# Patient Record
Sex: Female | Born: 1935 | Race: White | Hispanic: No | Marital: Married | Smoking: Never smoker
Health system: Southern US, Community
[De-identification: ages and names within clinical notes are randomized; demographics above are authoritative.]

## PROBLEM LIST (undated history)

## (undated) DIAGNOSIS — I4891 Unspecified atrial fibrillation: Secondary | ICD-10-CM

## (undated) DIAGNOSIS — N189 Chronic kidney disease, unspecified: Secondary | ICD-10-CM

## (undated) DIAGNOSIS — C4490 Unspecified malignant neoplasm of skin, unspecified: Secondary | ICD-10-CM

## (undated) DIAGNOSIS — I2510 Atherosclerotic heart disease of native coronary artery without angina pectoris: Secondary | ICD-10-CM

## (undated) HISTORY — DX: Chronic kidney disease, unspecified: N18.9

## (undated) HISTORY — PX: KIDNEY SURGERY: SHX687

## (undated) HISTORY — DX: Unspecified malignant neoplasm of skin, unspecified: C44.90

---

## 1961-09-15 HISTORY — PX: ABDOMINAL HYSTERECTOMY: SHX81

## 1961-09-15 HISTORY — PX: PARTIAL COLECTOMY: SHX5273

## 1983-09-16 HISTORY — PX: BREAST SURGERY: SHX581

## 1998-03-15 HISTORY — PX: CHOLECYSTECTOMY: SHX55

## 2005-01-13 ENCOUNTER — Ambulatory Visit: Payer: Self-pay | Admitting: Ophthalmology

## 2005-01-24 ENCOUNTER — Ambulatory Visit: Payer: Self-pay | Admitting: Ophthalmology

## 2005-03-06 ENCOUNTER — Ambulatory Visit: Payer: Self-pay | Admitting: Family Medicine

## 2005-04-07 ENCOUNTER — Ambulatory Visit: Payer: Self-pay | Admitting: Gastroenterology

## 2005-04-07 ENCOUNTER — Emergency Department: Payer: Self-pay | Admitting: Emergency Medicine

## 2006-02-16 DIAGNOSIS — C44311 Basal cell carcinoma of skin of nose: Secondary | ICD-10-CM | POA: Insufficient documentation

## 2006-03-26 ENCOUNTER — Ambulatory Visit: Payer: Self-pay | Admitting: Family Medicine

## 2007-05-06 ENCOUNTER — Ambulatory Visit: Payer: Self-pay | Admitting: Family Medicine

## 2008-05-09 ENCOUNTER — Ambulatory Visit: Payer: Self-pay | Admitting: Family Medicine

## 2008-05-10 ENCOUNTER — Ambulatory Visit: Payer: Self-pay | Admitting: Cardiovascular Disease

## 2008-05-12 ENCOUNTER — Ambulatory Visit: Payer: Self-pay | Admitting: Family Medicine

## 2008-09-30 ENCOUNTER — Emergency Department: Payer: Self-pay | Admitting: Emergency Medicine

## 2009-05-10 ENCOUNTER — Ambulatory Visit: Payer: Self-pay | Admitting: Family Medicine

## 2009-05-18 ENCOUNTER — Ambulatory Visit: Payer: Self-pay | Admitting: Unknown Physician Specialty

## 2009-06-13 ENCOUNTER — Ambulatory Visit: Payer: Self-pay | Admitting: Unknown Physician Specialty

## 2010-05-13 ENCOUNTER — Ambulatory Visit: Payer: Self-pay | Admitting: Family Medicine

## 2011-05-26 ENCOUNTER — Ambulatory Visit: Payer: Self-pay | Admitting: Family Medicine

## 2011-11-21 ENCOUNTER — Ambulatory Visit: Payer: Self-pay | Admitting: Ophthalmology

## 2012-07-27 ENCOUNTER — Ambulatory Visit: Payer: Self-pay | Admitting: Family Medicine

## 2013-10-13 ENCOUNTER — Ambulatory Visit: Payer: Self-pay | Admitting: Family Medicine

## 2014-10-17 ENCOUNTER — Ambulatory Visit: Payer: Self-pay | Admitting: Family Medicine

## 2015-01-01 LAB — CBC AND DIFFERENTIAL
HCT: 37 % (ref 36–46)
HEMOGLOBIN: 12.1 g/dL (ref 12.0–16.0)
Neutrophils Absolute: 65 /uL
PLATELETS: 244 10*3/uL (ref 150–399)
WBC: 6.9 10*3/mL

## 2015-01-01 LAB — BASIC METABOLIC PANEL
BUN: 17 mg/dL (ref 4–21)
CREATININE: 1.2 mg/dL — AB (ref 0.5–1.1)
Glucose: 97 mg/dL
POTASSIUM: 4.4 mmol/L (ref 3.4–5.3)
Sodium: 143 mmol/L (ref 137–147)

## 2015-01-01 LAB — LIPID PANEL
CHOLESTEROL: 270 mg/dL — AB (ref 0–200)
HDL: 88 mg/dL — AB (ref 35–70)
LDL CALC: 157 mg/dL
TRIGLYCERIDES: 123 mg/dL (ref 40–160)

## 2015-01-01 LAB — HEPATIC FUNCTION PANEL
ALT: 13 U/L (ref 7–35)
AST: 19 U/L (ref 13–35)
Alkaline Phosphatase: 69 U/L (ref 25–125)
Bilirubin, Total: 0.8 mg/dL

## 2015-01-01 LAB — TSH: TSH: 1.24 u[IU]/mL (ref 0.41–5.90)

## 2015-04-05 ENCOUNTER — Other Ambulatory Visit: Payer: Self-pay | Admitting: Family Medicine

## 2015-04-05 DIAGNOSIS — F419 Anxiety disorder, unspecified: Secondary | ICD-10-CM

## 2015-04-05 MED ORDER — CLONAZEPAM 0.5 MG PO TABS: 0.5000 mg | ORAL_TABLET | Freq: Every day | ORAL | Status: DC

## 2015-04-05 NOTE — Telephone Encounter (Signed)
OK to call in rx. Thanks.

## 2015-04-05 NOTE — Telephone Encounter (Signed)
ClonazePAM..Marland Kitchen    Uses [facility]   Thanks [place]

## 2015-04-06 ENCOUNTER — Telehealth: Payer: Self-pay | Admitting: Family Medicine

## 2015-04-06 ENCOUNTER — Other Ambulatory Visit: Payer: Self-pay | Admitting: Family Medicine

## 2015-04-06 NOTE — Telephone Encounter (Signed)
Med called in. Tried to call pt to let her know and LMTCB

## 2015-04-16 ENCOUNTER — Telehealth: Payer: Self-pay | Admitting: Family Medicine

## 2015-04-16 NOTE — Telephone Encounter (Signed)
Pt called to get an appointment with Dr. Venia Minks because the medication refills that she got were causing her to feel dry and pt increased her fluids but still feels dry. I advised pt I could put on schedule for tomorrow but that I was also going to see if she needed to speak with a nurse. Pt stated ok and hung up. Thanks TNP

## 2015-04-17 ENCOUNTER — Encounter: Payer: Self-pay | Admitting: Family Medicine

## 2015-04-17 ENCOUNTER — Telehealth: Payer: Self-pay

## 2015-04-17 ENCOUNTER — Ambulatory Visit (INDEPENDENT_AMBULATORY_CARE_PROVIDER_SITE_OTHER): Payer: Medicare Other | Admitting: Family Medicine

## 2015-04-17 VITALS — BP 140/76 | HR 74 | Temp 98.4°F | Resp 16 | Wt 122.6 lb

## 2015-04-17 DIAGNOSIS — K589 Irritable bowel syndrome without diarrhea: Secondary | ICD-10-CM | POA: Insufficient documentation

## 2015-04-17 DIAGNOSIS — G4700 Insomnia, unspecified: Secondary | ICD-10-CM | POA: Diagnosis not present

## 2015-04-17 DIAGNOSIS — K219 Gastro-esophageal reflux disease without esophagitis: Secondary | ICD-10-CM | POA: Insufficient documentation

## 2015-04-17 DIAGNOSIS — I2510 Atherosclerotic heart disease of native coronary artery without angina pectoris: Secondary | ICD-10-CM | POA: Insufficient documentation

## 2015-04-17 DIAGNOSIS — E785 Hyperlipidemia, unspecified: Secondary | ICD-10-CM | POA: Insufficient documentation

## 2015-04-17 DIAGNOSIS — K635 Polyp of colon: Secondary | ICD-10-CM | POA: Insufficient documentation

## 2015-04-17 DIAGNOSIS — E559 Vitamin D deficiency, unspecified: Secondary | ICD-10-CM | POA: Insufficient documentation

## 2015-04-17 DIAGNOSIS — R319 Hematuria, unspecified: Secondary | ICD-10-CM | POA: Insufficient documentation

## 2015-04-17 MED ORDER — TEMAZEPAM 7.5 MG PO CAPS: 7.5000 mg | ORAL_CAPSULE | Freq: Every evening | ORAL | Status: DC | PRN

## 2015-04-17 NOTE — Progress Notes (Signed)
Patient ID: Amanda Robinson, female   DOB: [birth year], 81 y.o.   MRN: [id number]       [name] Female    DOB: [birth year]   81 y.o.   MRN: [id number] Visit Date: 04/17/2015  Today's Provider: Margarita Rana, MD   Chief Complaint  Patient presents with  . Follow-up    medication clonazepam 0.5 mg, "my mouth and throat get so dry with the medication" pt stated  . Follow-up    gerd  . Abdominal Pain    "IBS" pt stated, I have spells stomach pain,  . Follow-up    anxiety   Subjective:    Abdominal Pain This is a recurrent problem. The current episode started more than 1 year ago. The onset quality is undetermined. The problem occurs every several days. The problem has been unchanged. Pain location: accross, taking pantoprazole, it helps:" pt stated. The pain is at a severity of 3/10. The pain is mild.   Insomnia- Feels like Clonazepam is drying out her mouth. Even at 1/2 a tablet. Really wants to try another medication.  Has been on this medication for a long time. Has tried multiple OTC medication with out relief. Sister tried Temazepam and it really helps.    Allergies  Allergen Reactions  . Clarithromycin   . Colesevelam Hcl     Burns stomach.  . Propoxyphene    Previous Medications   ASPIRIN 81 MG CHEWABLE TABLET    Chew by mouth.   CLONAZEPAM (KLONOPIN) 0.5 MG TABLET    Take 0.5 mg by mouth at bedtime as needed for anxiety.   PANTOPRAZOLE (PROTONIX) 40 MG TABLET    Take by mouth.   PAROXETINE (PAXIL) 20 MG TABLET    Take by mouth.   SIMVASTATIN (ZOCOR) 20 MG TABLET    Take by mouth.    Review of Systems  Constitutional: Negative.   Eyes: Negative.   Respiratory: Negative.   Gastrointestinal: Positive for abdominal pain.  Neurological: Negative.   Psychiatric/Behavioral: Negative for behavioral problems. The patient is not nervous/anxious.        Does have insomnia if does not take her medication.     History  Substance Use Topics  . Smoking status:  Never Smoker   . Smokeless tobacco: Never Used  . Alcohol Use: No   Objective:   BP 140/76 mmHg  Pulse 74  Temp(Src) 98.4 F (36.9 C) (Oral)  Resp 16  Wt 122 lb 9.6 oz (55.611 kg)  Physical Exam  Constitutional: She is oriented to person, place, and time. She appears well-developed and well-nourished.  Cardiovascular: Normal rate and regular rhythm.   Pulmonary/Chest: Effort normal and breath sounds normal.  Neurological: She is alert and oriented to person, place, and time.      Assessment & Plan:     1. Cannot sleep Will try Temazepam, has helped her sister sleep without side effects.   Feels that Klonopin is too strong and causing dry mouth.   - temazepam (RESTORIL) 7.5 MG capsule; Take 1 capsule (7.5 mg total) by mouth at bedtime as needed for sleep. (Patient taking differently: Take 15 mg by mouth at bedtime as needed for sleep. )  Dispense: 30 capsule; Refill: [place], MD       Margarita Rana, MD  [facility]

## 2015-04-17 NOTE — Telephone Encounter (Signed)
Temazepam 7.5 mg was not covered by insurance. Per Dr. Sharyon Medicus verbal instructions, pharmacy will prescribe Temazepam 15 mg capsule. Renaldo Fiddler, CMA

## 2015-06-04 ENCOUNTER — Ambulatory Visit (INDEPENDENT_AMBULATORY_CARE_PROVIDER_SITE_OTHER): Payer: Medicare Other | Admitting: Family Medicine

## 2015-06-04 ENCOUNTER — Encounter: Payer: Self-pay | Admitting: Family Medicine

## 2015-06-04 VITALS — BP 126/66 | HR 88 | Temp 97.6°F | Resp 16 | Ht 63.0 in | Wt 118.0 lb

## 2015-06-04 DIAGNOSIS — G4700 Insomnia, unspecified: Secondary | ICD-10-CM

## 2015-06-04 DIAGNOSIS — M8580 Other specified disorders of bone density and structure, unspecified site: Secondary | ICD-10-CM | POA: Insufficient documentation

## 2015-06-04 DIAGNOSIS — Z0000 Encounter for general adult medical examination without abnormal findings: Secondary | ICD-10-CM

## 2015-06-04 DIAGNOSIS — Z23 Encounter for immunization: Secondary | ICD-10-CM

## 2015-06-04 MED ORDER — TEMAZEPAM 15 MG PO CAPS: 15.0000 mg | ORAL_CAPSULE | Freq: Every evening | ORAL | Status: DC | PRN

## 2015-06-04 NOTE — Progress Notes (Signed)
Patient ID: Amanda Robinson, female   DOB: 09-Dec-1921, 81 y.o.   MRN: [id number]        [name], Female    DOB: July 14, 1922, 81 y.o.   MRN: [id number] Visit Date: 06/04/2015  Today's Provider: Margarita Rana, MD   Chief Complaint  Patient presents with  . Medicare Wellness   Subjective:    Annual wellness visit [name] is a 81 y.o. female. She feels well. She reports exercising daily; stays very active with daily activities. She reports she is sleeping well.  10/10/13 CPE 10/17/14 Mammogram-BI-RADS 1 06/13/09 Colonoscopy-polyp, diverticulosis, internal hemorrhoids 04/12/07 BMD-osteopenia, recheck in 2 years 04/19/08 EKG  01/01/15 Labs stable, except low vitamin D. Recommended to start OTC Vit D 1000 iu daily. -----------------------------------------------------------   Review of Systems  Constitutional: Negative.   HENT: Positive for tinnitus and voice change.   Eyes: Positive for photophobia.  Cardiovascular: Negative.   Gastrointestinal: Positive for abdominal pain.  Endocrine: Positive for heat intolerance.  Genitourinary: Negative.   Musculoskeletal: Negative.   Skin: Negative.   Allergic/Immunologic: Negative.   Neurological: Positive for headaches.  Hematological: Negative.   Psychiatric/Behavioral: Positive for sleep disturbance. The patient is nervous/anxious.     Social History   Social History  . Marital Status: Married    Spouse Name: N/A  . Number of Children: 1  . Years of Education: N/A   Occupational History  . Not on file.   Social History Main Topics  . Smoking status: Never Smoker   . Smokeless tobacco: Never Used  . Alcohol Use: No  . Drug Use: No  . Sexual Activity: Not on file   Other Topics Concern  . Not on file   Social History Narrative    Patient Active Problem List   Diagnosis Date Noted  . Osteopenia 06/04/2015  . Atherosclerosis of coronary artery 04/17/2015  . Colon polyp 04/17/2015  .  Clinical depression 04/17/2015  . Acid reflux 04/17/2015  . Blood in the urine 04/17/2015  . HLD (hyperlipidemia) 04/17/2015  . Cannot sleep 04/17/2015  . 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 Avitaminosis D 04/17/2015  . Anxiety 04/05/2015  . Basal cell carcinoma of nose 02/16/2006    Past Surgical History  Procedure Laterality Date  . Cholecystectomy  03/1998  . Breast surgery Bilateral 1985    Biopsy  . Abdominal hysterectomy  1963  . Partial colectomy  1963  . Kidney surgery      due to ureteropelvic stricture    Her family history includes Alcohol abuse in her brother; Alzheimer's disease in her father; Brain cancer in her sister; Colon cancer in her other; Esophageal cancer in her mother; Ovarian cancer in her other; Parkinsonism in her brother.    Previous Medications   CHOLECALCIFEROL (VITAMIN D) 1000 UNITS TABLET    Take 1,000 Units by mouth daily.   PANTOPRAZOLE (PROTONIX) 40 MG TABLET    Take by mouth.   PAROXETINE (PAXIL) 20 MG TABLET    Take by mouth.   SIMVASTATIN (ZOCOR) 20 MG TABLET    Take by mouth.    Patient Care Team: Margarita Rana, MD as PCP - General (Family Medicine)     Objective:   Vitals: BP 126/66 mmHg  Pulse 88  Temp(Src) 97.6 F (36.4 C) (Oral)  Resp 16  Ht 5\' 3"  (1.6 m)  Wt 118 lb (53.524 kg)  BMI 20.91 kg/m2  SpO2 100%  Physical Exam  Constitutional: She is oriented to person, place, and  time. She appears well-developed and well-nourished.  HENT:  Head: Normocephalic and atraumatic.  Right Ear: Tympanic membrane, external ear and ear canal normal.  Left Ear: Tympanic membrane, external ear and ear canal normal.  Nose: Nose normal.  Mouth/Throat: Uvula is midline, oropharynx is clear and moist and mucous membranes are normal.  Eyes: Conjunctivae, EOM and lids are normal. Pupils are equal, round, and reactive to light.  Neck: Trachea normal and normal range of motion. Neck supple. Carotid bruit is not present. No thyroid mass and no  thyromegaly present.  Cardiovascular: Normal rate, regular rhythm and normal heart sounds.   Pulmonary/Chest: Effort normal and breath sounds normal.  Abdominal: Soft. Normal appearance and bowel sounds are normal. There is no hepatosplenomegaly. There is no tenderness.  Musculoskeletal: Normal range of motion.  Lymphadenopathy:    She has no cervical adenopathy.    She has no axillary adenopathy.  Neurological: She is alert and oriented to person, place, and time. She has normal strength. No cranial nerve deficit.  Skin: Skin is warm, dry and intact.  Psychiatric: She has a normal mood and affect. Her speech is normal and behavior is normal. Judgment and thought content normal. Cognition and memory are normal.    Activities of Daily Living In your present state of health, do you have any difficulty performing the following activities: 06/04/2015  Hearing? Y  Vision? N  Difficulty concentrating or making decisions? N  Walking or climbing stairs? N  Dressing or bathing? N  Doing errands, shopping? N    Fall Risk Assessment Fall Risk  06/04/2015 04/17/2015  Falls in the past year? No No     Depression Screen PHQ 2/9 Scores 06/04/2015 04/17/2015  PHQ - 2 Score 0 1  Exception Documentation - Medical reason   History  Alcohol Use No    Cognitive Testing - 6-CIT  Correct? Score   What year is it? yes 0 0 or 4  What month is it? yes 0 0 or 3  Memorize:    [name],  44,  [address],  [place],      What time is it? (within 1 hour) yes 0 0 or 3  Count backwards from 20 yes 0 0, 2, or 4  Name the months of the year yes 0 0, 2, or 4  Repeat name & address above yes 1 0, 2, 4, 6, 8, or 10       TOTAL SCORE  1/28   Interpretation:  Normal  Normal (0-7) Abnormal (8-28)       Assessment & Plan:     Annual Wellness Visit  Reviewed patient's Family Medical History Reviewed and updated list of patient's medical providers Assessment of cognitive impairment was done Assessed  patient's functional ability Established a written schedule for health screening [place] Completed and Reviewed  Exercise Activities and Dietary recommendations Goals    . Exercise 150 minutes per week (moderate activity)       Immunization History  Administered Date(s) Administered  . Influenza, High Dose Seasonal PF 06/04/2015  . Pneumococcal Conjugate-13 01/01/2015  . Pneumococcal Polysaccharide-23 07/09/2006  . Td 07/09/2006  . Tdap 06/23/2011    Health Maintenance  Topic Date Due  . COLONOSCOPY  07/19/1974  . ZOSTAVAX  07/19/1984  . DEXA SCAN  07/19/1989  . INFLUENZA VACCINE  04/16/2015  . TETANUS/TDAP  06/22/2021  . PNA vac Low Risk Adult  Completed       1. Medicare annual wellness  visit, subsequent Stable. Patient advised to continue eating healthy and exercise daily.   2. Need for influenza vaccination - Flu vaccine HIGH DOSE PF  3. Insomnia Stable. Patient advised to continue temazepam 15 mg as below. - temazepam (RESTORIL) 15 MG capsule; Take 1 capsule (15 mg total) by mouth at bedtime as needed for sleep.  Dispense: 30 capsule; Refill: 5  6. Osteopenia BMD-order.   Patient seen and examined by Dr. Jerrell Belfast, and note scribed by Philbert Riser. [place], CMA.    ------------------------------------------------------------------------------------------------------------

## 2015-06-05 ENCOUNTER — Telehealth: Payer: Self-pay | Admitting: Family Medicine

## 2015-06-05 NOTE — Telephone Encounter (Signed)
FYI---Pt did not want to set up appointment for bone density

## 2015-06-27 ENCOUNTER — Telehealth: Payer: Self-pay | Admitting: Family Medicine

## 2015-06-27 NOTE — Telephone Encounter (Signed)
Pt stated that she started taking temazepam (RESTORIL) 15 MG capsule she has been having crazy dreams and feels like it is messing with her mind. Pt stated she has been forgetting were she puts things. Pt stated her mind was fine before taking the medication. Pt stated the last time she took the medication was on 06/25/15 before bed. Thanks TNP

## 2015-06-27 NOTE — Telephone Encounter (Signed)
Amanda Robinson has noticed improvement with her symptoms since she has stopped temazepam.  Thanks,   -[name]

## 2015-12-25 ENCOUNTER — Other Ambulatory Visit: Payer: Self-pay

## 2015-12-25 MED ORDER — SIMVASTATIN 20 MG PO TABS: 20.0000 mg | ORAL_TABLET | Freq: Every day | ORAL | Status: DC

## 2015-12-25 MED ORDER — PANTOPRAZOLE SODIUM 40 MG PO TBEC: 40.0000 mg | DELAYED_RELEASE_TABLET | Freq: Every day | ORAL | Status: DC

## 2015-12-25 NOTE — Telephone Encounter (Signed)
LOV 06/04/15 and no future appointments made, refill request from [facility] mail service for Simvastatin and Pantoprazole-aa

## 2016-01-17 ENCOUNTER — Ambulatory Visit (INDEPENDENT_AMBULATORY_CARE_PROVIDER_SITE_OTHER): Payer: Medicare Other | Admitting: Family Medicine

## 2016-01-17 ENCOUNTER — Encounter: Payer: Self-pay | Admitting: Family Medicine

## 2016-01-17 VITALS — BP 128/72 | HR 79 | Temp 98.0°F | Resp 16 | Wt 119.6 lb

## 2016-01-17 DIAGNOSIS — I2510 Atherosclerotic heart disease of native coronary artery without angina pectoris: Secondary | ICD-10-CM

## 2016-01-17 DIAGNOSIS — F419 Anxiety disorder, unspecified: Secondary | ICD-10-CM

## 2016-01-17 DIAGNOSIS — E559 Vitamin D deficiency, unspecified: Secondary | ICD-10-CM | POA: Diagnosis not present

## 2016-01-17 DIAGNOSIS — F329 Major depressive disorder, single episode, unspecified: Secondary | ICD-10-CM | POA: Diagnosis not present

## 2016-01-17 DIAGNOSIS — Z1239 Encounter for other screening for malignant neoplasm of breast: Secondary | ICD-10-CM | POA: Diagnosis not present

## 2016-01-17 DIAGNOSIS — E785 Hyperlipidemia, unspecified: Secondary | ICD-10-CM | POA: Diagnosis not present

## 2016-01-17 DIAGNOSIS — F32A Depression, unspecified: Secondary | ICD-10-CM

## 2016-01-17 MED ORDER — PAROXETINE HCL 20 MG PO TABS: 20.0000 mg | ORAL_TABLET | Freq: Every day | ORAL | Status: DC

## 2016-01-17 NOTE — Progress Notes (Signed)
Patient ID: Amanda Robinson, female   DOB: [birth year], 82 y.o.   MRN: [id number]   [name] Female    DOB: [birth year]   82 y.o.   MRN: [id number] Visit Date: 01/17/2016  Today's Provider: Margarita Rana, MD   Chief Complaint  Patient presents with  . Insomnia  . Depression  . Hyperlipidemia  . Follow-up   Subjective:    HPI    Lipid/Cholesterol, Follow-up:   Last seen for this 8 months ago.  Management since that visit includes continue Simvastatin 20 mg.  Last Lipid Panel:    Component Value Date/Time   CHOL 270* 01/01/2015   TRIG 123 01/01/2015   HDL 88* 01/01/2015   LDLCALC 157 01/01/2015    She reports good compliance with treatment. She is not having side effects.   Wt Readings from Last 3 Encounters:  01/17/16 119 lb 9.6 oz (54.25 kg)  06/04/15 118 lb (53.524 kg)  04/17/15 122 lb 9.6 oz (55.611 kg)    ------------------------------------------------------------------------ Insomnia Follow Up: Patient stopped taking Temazepam on 06/25/2015 due to crazy dreams. Patient started taking OTC sleep aid- 1/2 tablet a night. Patient reports symptoms have improved since taking OTC medication.  Depression Follow Up: Patient reports symptoms are stable. Patient reports good compliance with treatment. Patient denies any side effects of medication.     Previous Medications   CHOLECALCIFEROL (VITAMIN D) 1000 UNITS TABLET    Take 1,000 Units by mouth daily.   DOXYLAMINE SUCCINATE, SLEEP, (SLEEP AID PO)    Take by mouth.   PANTOPRAZOLE (PROTONIX) 40 MG TABLET    Take 1 tablet (40 mg total) by mouth daily.   PAROXETINE (PAXIL) 20 MG TABLET    Take by mouth.   SIMVASTATIN (ZOCOR) 20 MG TABLET    Take 1 tablet (20 mg total) by mouth daily.   TEMAZEPAM (RESTORIL) 15 MG CAPSULE    Take 1 capsule (15 mg total) by mouth at bedtime as needed for sleep.   Allergies  Allergen Reactions  . Clarithromycin   . Colesevelam Hcl     Burns stomach.  . Propoxyphene      Review of Systems  Constitutional: Negative.   HENT: Negative.   Eyes: Negative.   Respiratory: Negative.   Cardiovascular: Negative.   Gastrointestinal: Negative.   Endocrine: Negative.   Genitourinary: Negative.   Musculoskeletal: Negative.   Skin: Negative.   Allergic/Immunologic: Negative.   Neurological: Negative.   Hematological: Negative.   Psychiatric/Behavioral: Positive for sleep disturbance.       Depression     Social History  Substance Use Topics  . Smoking status: Never Smoker   . Smokeless tobacco: Never Used  . Alcohol Use: No   Objective:   BP 128/72 mmHg  Pulse 79  Temp(Src) 98 F (36.7 C) (Oral)  Resp 16  Wt 119 lb 9.6 oz (54.25 kg)  Physical Exam  Constitutional: She is oriented to person, place, and time. She appears well-developed and well-nourished.  Cardiovascular: Normal rate and regular rhythm.   Pulmonary/Chest: Effort normal and breath sounds normal.  Neurological: She is alert and oriented to person, place, and time.      Assessment & Plan:     1. Atherosclerosis of native coronary artery of native heart without angina pectoris Stable. Check labs.  - Lipid panel - Comprehensive Metabolic Panel (CMET) - CBC  2. HLD (hyperlipidemia) Check labs as above.   3. Clinical depression Stable. Refilled medication today. Would like to continue.  4. Avitaminosis D Will check labs.   - VITAMIN D 25 Hydroxy (Vit-D Deficiency, Fractures)  5. Anxiety Stable on Paxil. Husband has been sick. Did loose some weight but gained it back.   - TSH  6. Breast cancer screening Will call for appointment. Wishes to continue with screening.   - MM Digital Screening   Follow up: In 6 months with Lillia Abed, PA for Wellness.   Patient was seen and examined by Jerrell Belfast, MD, and note scribed by Melanie Crazier, [place].   Margarita Rana, MD

## 2016-01-22 LAB — LIPID PANEL
CHOLESTEROL TOTAL: 206 mg/dL — AB (ref 100–199)
Chol/HDL Ratio: 2.8 ratio units (ref 0.0–4.4)
HDL: 73 mg/dL (ref >39)
LDL Calculated: 110 mg/dL — ABNORMAL HIGH (ref 0–99)
Triglycerides: 113 mg/dL (ref 0–149)
VLDL CHOLESTEROL CAL: 23 mg/dL (ref 5–40)

## 2016-01-22 LAB — COMPREHENSIVE METABOLIC PANEL
ALBUMIN: 3.8 g/dL (ref 3.2–4.6)
ALK PHOS: 75 IU/L (ref 39–117)
ALT: 11 IU/L (ref 0–32)
AST: 16 IU/L (ref 0–40)
Albumin/Globulin Ratio: 1.2 (ref 1.2–2.2)
BILIRUBIN TOTAL: 0.8 mg/dL (ref 0.0–1.2)
BUN / CREAT RATIO: 13 (ref 12–28)
BUN: 15 mg/dL (ref 10–36)
CHLORIDE: 101 mmol/L (ref 96–106)
CO2: 24 mmol/L (ref 18–29)
CREATININE: 1.14 mg/dL — AB (ref 0.57–1.00)
Calcium: 9.4 mg/dL (ref 8.7–10.3)
GFR calc non Af Amer: 42 mL/min/{1.73_m2} — ABNORMAL LOW (ref >59)
GFR, EST AFRICAN AMERICAN: 49 mL/min/{1.73_m2} — AB (ref >59)
GLOBULIN, TOTAL: 3.1 g/dL (ref 1.5–4.5)
Glucose: 92 mg/dL (ref 65–99)
Potassium: 4.6 mmol/L (ref 3.5–5.2)
SODIUM: 140 mmol/L (ref 134–144)
Total Protein: 6.9 g/dL (ref 6.0–8.5)

## 2016-01-22 LAB — CBC
Hematocrit: 34.3 % (ref 34.0–46.6)
Hemoglobin: 11.4 g/dL (ref 11.1–15.9)
MCH: 30 pg (ref 26.6–33.0)
MCHC: 33.2 g/dL (ref 31.5–35.7)
MCV: 90 fL (ref 79–97)
PLATELETS: 240 10*3/uL (ref 150–379)
RBC: 3.8 x10E6/uL (ref 3.77–5.28)
RDW: 13.7 % (ref 12.3–15.4)
WBC: 6.4 10*3/uL (ref 3.4–10.8)

## 2016-01-22 LAB — TSH: TSH: 0.896 u[IU]/mL (ref 0.450–4.500)

## 2016-01-22 LAB — VITAMIN D 25 HYDROXY (VIT D DEFICIENCY, FRACTURES): Vit D, 25-Hydroxy: 18.5 ng/mL — ABNORMAL LOW (ref 30.0–100.0)

## 2016-01-28 ENCOUNTER — Ambulatory Visit (INDEPENDENT_AMBULATORY_CARE_PROVIDER_SITE_OTHER): Payer: Medicare Other | Admitting: Physician Assistant

## 2016-01-28 ENCOUNTER — Encounter: Payer: Self-pay | Admitting: Physician Assistant

## 2016-01-28 VITALS — BP 140/82 | HR 80 | Temp 98.3°F | Resp 16 | Wt 117.6 lb

## 2016-01-28 DIAGNOSIS — S91311A Laceration without foreign body, right foot, initial encounter: Secondary | ICD-10-CM | POA: Diagnosis not present

## 2016-01-28 MED ORDER — CEPHALEXIN 500 MG PO CAPS: 500.0000 mg | ORAL_CAPSULE | Freq: Two times a day (BID) | ORAL | Status: DC

## 2016-01-28 NOTE — Patient Instructions (Signed)

## 2016-01-28 NOTE — Progress Notes (Signed)
[name] Female    DOB: 1921-12-01   82 y.o.   MRN: [id number] Visit Date: 01/28/2016  Today's Provider: Mar Daring, PA-C   Chief Complaint  Patient presents with  . Foot Pain   Subjective:    Foot Pain This is a new (Right foot) problem. The current episode started 1 to 4 weeks ago (For 10 days). The problem occurs constantly (Per patient, she got cut with a storm door. This happened 10 days ago). The problem has been gradually improving (Per patient was worse, especially yesterday was very swollen). Pertinent negatives include no arthralgias, chest pain, chills, congestion, coughing, fatigue, fever, joint swelling, myalgias, nausea, numbness, rash, vomiting or weakness. Associated symptoms comments: Burning pain and redness on it with drainage. Exacerbated by: throbs at night if she has been up on it alot throughout the day. She has tried ice and acetaminophen (Hydrogen peroxide until 2 days ago, bandage and neosporin) for the symptoms. The treatment provided no relief.       Allergies  Allergen Reactions  . Clarithromycin   . Colesevelam Hcl     Burns stomach.  . Propoxyphene    Previous Medications   CHOLECALCIFEROL (VITAMIN D) 1000 UNITS TABLET    Take 1,000 Units by mouth daily.   DOXYLAMINE SUCCINATE, SLEEP, (SLEEP AID PO)    Take by mouth.   PANTOPRAZOLE (PROTONIX) 40 MG TABLET    Take 1 tablet (40 mg total) by mouth daily.   PAROXETINE (PAXIL) 20 MG TABLET    Take 1 tablet (20 mg total) by mouth daily.   SIMVASTATIN (ZOCOR) 20 MG TABLET    Take 1 tablet (20 mg total) by mouth daily.   TEMAZEPAM (RESTORIL) 15 MG CAPSULE    Take 1 capsule (15 mg total) by mouth at bedtime as needed for sleep.    Review of Systems  Constitutional: Negative for fever, chills and fatigue.  HENT: Negative for congestion.   Respiratory: Negative for cough.   Cardiovascular: Negative for chest pain.  Gastrointestinal: Negative for nausea and vomiting.    Musculoskeletal: Negative for myalgias, joint swelling and arthralgias.  Skin: Negative for rash.  Neurological: Negative for weakness and numbness.    Social History  Substance Use Topics  . Smoking status: Never Smoker   . Smokeless tobacco: Never Used  . Alcohol Use: No   Objective:   BP 140/82 mmHg  Pulse 80  Temp(Src) 98.3 F (36.8 C) (Oral)  Resp 16  Wt 117 lb 9.6 oz (53.343 kg)  Physical Exam  Constitutional: She appears well-developed and well-nourished. No distress.  Cardiovascular: Normal rate, regular rhythm and normal heart sounds.   No murmur heard. Pulmonary/Chest: Effort normal and breath sounds normal. No respiratory distress. She has no wheezes. She has no rales.  Skin: Laceration noted. She is not diaphoretic.           Assessment & Plan:     1. Foot laceration, right, initial encounter Advised her to try to keep foot elevated for swelling. May use ice and heat alternating 15-20 minutes on. Advised to not use hydrogen peroxide any longer. May continue to use neosporin and cover wound if needed. Keflex given for redness and tenderness. She is to call if her foot does not improve or if it worsens in the meantime. Discussed if foot continues to hurt may benefit with xray to R/O fracture.  - cephALEXin (KEFLEX) 500 MG capsule; Take 1 capsule (500 mg total)  by mouth 2 (two) times daily.  Dispense: 14 capsule; Refill: 0       Mar Daring, PA-C  [facility]

## 2016-03-17 ENCOUNTER — Encounter: Payer: Self-pay | Admitting: Physician Assistant

## 2016-03-17 ENCOUNTER — Ambulatory Visit (INDEPENDENT_AMBULATORY_CARE_PROVIDER_SITE_OTHER): Payer: Medicare Other | Admitting: Physician Assistant

## 2016-03-17 VITALS — BP 160/80 | HR 84 | Temp 98.5°F | Resp 16 | Wt 118.6 lb

## 2016-03-17 DIAGNOSIS — L03116 Cellulitis of left lower limb: Secondary | ICD-10-CM

## 2016-03-17 DIAGNOSIS — T148 Other injury of unspecified body region: Secondary | ICD-10-CM | POA: Diagnosis not present

## 2016-03-17 DIAGNOSIS — W548XXA Other contact with dog, initial encounter: Secondary | ICD-10-CM | POA: Diagnosis not present

## 2016-03-17 MED ORDER — AMOXICILLIN-POT CLAVULANATE 875-125 MG PO TABS: 1.0000 | ORAL_TABLET | Freq: Two times a day (BID) | ORAL | Status: DC

## 2016-03-17 NOTE — Progress Notes (Signed)
[name] Female    DOB: 08-Jan-1922   82 y.o.   MRN: [id number] Visit Date: 03/17/2016  Today's Provider: Mar Daring, PA-C   Chief Complaint  Patient presents with  . Leg Pain   Subjective:    Leg Pain  The incident occurred more than 1 week ago. The incident occurred at home. Injury mechanism: "The dog scratched her two weeks ago. The pain is present in the left leg. The quality of the pain is described as burning (sometimes a sharp pain). The pain is mild. She reports no foreign bodies present. The symptoms are aggravated by movement. She has tried rest, acetaminophen and elevation (warm water. Neosporin) for the symptoms. The treatment provided no relief.  Leg is red around the laceration. Drainage is clear with blood. No purulent drainage, no foul smell. She also reports that her leg gets swollen when she is up on it during the day. No pain with walking.      Allergies  Allergen Reactions  . Clarithromycin   . Colesevelam Hcl     Burns stomach.  . Propoxyphene    Current Meds  Medication Sig  . cholecalciferol (VITAMIN D) 1000 UNITS tablet Take 1,000 Units by mouth daily.  . Doxylamine Succinate, Sleep, (SLEEP AID PO) Take by mouth.  . pantoprazole (PROTONIX) 40 MG tablet Take 1 tablet (40 mg total) by mouth daily.  Marland Kitchen PARoxetine (PAXIL) 20 MG tablet Take 1 tablet (20 mg total) by mouth daily.  . simvastatin (ZOCOR) 20 MG tablet Take 1 tablet (20 mg total) by mouth daily.    Review of Systems  Constitutional: Negative.   Respiratory: Negative.   Cardiovascular: Positive for leg swelling (occasional). Negative for chest pain and palpitations.  Musculoskeletal: Negative for gait problem.  Skin: Positive for wound.    Social History  Substance Use Topics  . Smoking status: Never Smoker   . Smokeless tobacco: Never Used  . Alcohol Use: No   Objective:   BP 160/80 mmHg  Pulse 84  Temp(Src) 98.5 F (36.9 C) (Oral)  Resp 16  Wt 118 lb  9.6 oz (53.797 kg)  Physical Exam  Constitutional: She appears well-developed and well-nourished. No distress.  Neck: Normal range of motion. Neck supple.  Cardiovascular: Normal rate, regular rhythm and normal heart sounds.  Exam reveals no gallop and no friction rub.   No murmur heard. Pulmonary/Chest: Effort normal and breath sounds normal. No respiratory distress. She has no wheezes. She has no rales.  Skin: Laceration noted. She is not diaphoretic.     Vitals reviewed.     Assessment & Plan:     1. Dog scratch Will give augmentin as below for infection. Wound was cleaned today in the office and dressed. Silvadene cream was applied to the wound, then covered with a small piece of vaseline gauze, and covered with a non-adherent pad. It was then secured with a self-adherent bandage. She is to leave on for 24 hours then remove. She can continue to wash wound with warm water and soap. I will see her back in 2-3 days to recheck wound. - amoxicillin-clavulanate (AUGMENTIN) 875-125 MG tablet; Take 1 tablet by mouth 2 (two) times daily.  Dispense: 20 tablet; Refill: 0  2. Cellulitis of left lower extremity See above medical treatment plan. - amoxicillin-clavulanate (AUGMENTIN) 875-125 MG tablet; Take 1 tablet by mouth 2 (two) times daily.  Dispense: 20 tablet; Refill: 0  Mar Daring, PA-C  [facility]

## 2016-03-17 NOTE — Patient Instructions (Signed)

## 2016-03-20 ENCOUNTER — Ambulatory Visit (INDEPENDENT_AMBULATORY_CARE_PROVIDER_SITE_OTHER): Payer: Medicare Other | Admitting: Physician Assistant

## 2016-03-20 ENCOUNTER — Encounter: Payer: Self-pay | Admitting: Physician Assistant

## 2016-03-20 VITALS — BP 140/70 | HR 79 | Temp 98.1°F | Resp 16

## 2016-03-20 DIAGNOSIS — L03116 Cellulitis of left lower limb: Secondary | ICD-10-CM | POA: Diagnosis not present

## 2016-03-20 DIAGNOSIS — W548XXA Other contact with dog, initial encounter: Secondary | ICD-10-CM

## 2016-03-20 DIAGNOSIS — T148 Other injury of unspecified body region: Secondary | ICD-10-CM | POA: Diagnosis not present

## 2016-03-20 NOTE — Patient Instructions (Signed)

## 2016-03-20 NOTE — Progress Notes (Signed)
       [name] Female    DOB: [birth year]   82 y.o.   MRN: [id number] Visit Date: 03/20/2016  Today's Provider: Mar Daring, PA-C   No chief complaint on file.  Subjective:    HPI  Patient is here for 3 day follow-up on wound on left leg and cellulitis.She reports it looks much better. She states the pain is decreasing tremendously. She only has pain in one small area near the corner. It does not drain as much as before. Still denies fever, chills, nausea or vomiting.     Allergies  Allergen Reactions  . Clarithromycin   . Colesevelam Hcl     Burns stomach.  . Propoxyphene    Current Meds  Medication Sig  . amoxicillin-clavulanate (AUGMENTIN) 875-125 MG tablet Take 1 tablet by mouth 2 (two) times daily.  . cholecalciferol (VITAMIN D) 1000 UNITS tablet Take 1,000 Units by mouth daily.  . Doxylamine Succinate, Sleep, (SLEEP AID PO) Take by mouth.  . pantoprazole (PROTONIX) 40 MG tablet Take 1 tablet (40 mg total) by mouth daily.  Marland Kitchen PARoxetine (PAXIL) 20 MG tablet Take 1 tablet (20 mg total) by mouth daily.  . simvastatin (ZOCOR) 20 MG tablet Take 1 tablet (20 mg total) by mouth daily.    Review of Systems  Constitutional: Negative.   Respiratory: Negative.   Cardiovascular: Negative.   Gastrointestinal: Negative.   Neurological: Negative.     Social History  Substance Use Topics  . Smoking status: Never Smoker   . Smokeless tobacco: Never Used  . Alcohol Use: No   Objective:   BP 140/70 mmHg  Pulse 79  Temp(Src) 98.1 F (36.7 C) (Oral)  Resp 16  Wt   Physical Exam  Constitutional: She appears well-developed and well-nourished. No distress.  Cardiovascular: Normal rate, regular rhythm and normal heart sounds.  Exam reveals no gallop and no friction rub.   No murmur heard. Pulmonary/Chest: Effort normal and breath sounds normal. No respiratory distress. She has no wheezes. She has no rales.  Skin: Laceration noted. She is not diaphoretic.      Vitals reviewed.      Assessment & Plan:      1. Dog scratch Continuing to heal. Continue augmentin as previously prescribed. Will f/u in one week to make sure healing and another round not needed. She is to call if symptoms worsen before that time.  2. Cellulitis of left lower extremity See above medical treatment plan.       Mar Daring, PA-C  [facility]

## 2016-03-27 ENCOUNTER — Ambulatory Visit (INDEPENDENT_AMBULATORY_CARE_PROVIDER_SITE_OTHER): Payer: Medicare Other | Admitting: Physician Assistant

## 2016-03-27 ENCOUNTER — Encounter: Payer: Self-pay | Admitting: Physician Assistant

## 2016-03-27 VITALS — BP 130/60 | HR 89 | Temp 97.4°F | Resp 16 | Wt 119.4 lb

## 2016-03-27 DIAGNOSIS — L03116 Cellulitis of left lower limb: Secondary | ICD-10-CM | POA: Diagnosis not present

## 2016-03-27 DIAGNOSIS — W548XXA Other contact with dog, initial encounter: Secondary | ICD-10-CM

## 2016-03-27 DIAGNOSIS — T148 Other injury of unspecified body region: Secondary | ICD-10-CM

## 2016-03-27 MED ORDER — AMOXICILLIN-POT CLAVULANATE 875-125 MG PO TABS: 1.0000 | ORAL_TABLET | Freq: Two times a day (BID) | ORAL | Status: DC

## 2016-03-27 NOTE — Progress Notes (Signed)
       [name] Female    DOB: [birth year]   82 y.o.   MRN: [id number] Visit Date: 03/27/2016  Today's Provider: Mar Daring, PA-C   Chief Complaint  Patient presents with  . Follow-up    Dog Scratch   Subjective:    HPI Amanda Robinson is here for her 1 week follow-up dog scratch. She reports that she can walk bettter, sometimes gets some shooting pain. She finished the Augmentin last night. Wound is healing. No drainage, no foul smell. Denies fevers, chills, nausea or vomiting.    Allergies  Allergen Reactions  . Clarithromycin   . Colesevelam Hcl     Burns stomach.  . Propoxyphene    Current Meds  Medication Sig  . cholecalciferol (VITAMIN D) 1000 UNITS tablet Take 1,000 Units by mouth daily.  . Doxylamine Succinate, Sleep, (SLEEP AID PO) Take by mouth.  . pantoprazole (PROTONIX) 40 MG tablet Take 1 tablet (40 mg total) by mouth daily.  Marland Kitchen PARoxetine (PAXIL) 20 MG tablet Take 1 tablet (20 mg total) by mouth daily.  . simvastatin (ZOCOR) 20 MG tablet Take 1 tablet (20 mg total) by mouth daily.    Review of Systems  Constitutional: Negative.   Respiratory: Negative.   Cardiovascular: Negative.   Gastrointestinal: Negative.   Skin: Positive for wound.    Social History  Substance Use Topics  . Smoking status: Never Smoker   . Smokeless tobacco: Never Used  . Alcohol Use: No   Objective:   BP 130/60 mmHg  Pulse 89  Temp(Src) 97.4 F (36.3 C) (Oral)  Resp 16  Wt 119 lb 6.4 oz (54.159 kg)  Physical Exam  Constitutional: She appears well-developed and well-nourished. No distress.  Cardiovascular: Normal rate, regular rhythm and normal heart sounds.  Exam reveals no gallop and no friction rub.   No murmur heard. Pulmonary/Chest: Effort normal and breath sounds normal. No respiratory distress. She has no wheezes. She has no rales.  Skin: She is not diaphoretic.     Vitals reviewed.     Assessment & Plan:     1. Dog scratch Healing  well. Advised she may use just dry bandage now. Will give a few more days of antibiotic coverage due to redness. She is to call if the wound worsens or if she develops signs of infection. - amoxicillin-clavulanate (AUGMENTIN) 875-125 MG tablet; Take 1 tablet by mouth 2 (two) times daily.  Dispense: 8 tablet; Refill: 0  2. Cellulitis of left lower extremity Improving. See above. - amoxicillin-clavulanate (AUGMENTIN) 875-125 MG tablet; Take 1 tablet by mouth 2 (two) times daily.  Dispense: 8 tablet; Refill: 0       Mar Daring, PA-C  [facility]

## 2016-03-27 NOTE — Patient Instructions (Signed)
Amoxicillin; Clavulanic Acid tablets  What is this medicine?  AMOXICILLIN; CLAVULANIC ACID (a mox i SIL in; KLAV yoo lan ic AS id) is a penicillin antibiotic. It is used to treat certain kinds of bacterial infections. It will not work for colds, flu, or other viral infections.  This medicine may be used for other purposes; ask your health care provider or pharmacist if you have questions.  What should I tell my health care provider before I take this medicine?  They need to know if you have any of these conditions:  -bowel disease, like colitis  -kidney disease  -liver disease  -mononucleosis  -an unusual or allergic reaction to amoxicillin, penicillin, cephalosporin, other antibiotics, clavulanic acid, other medicines, foods, dyes, or preservatives  -pregnant or trying to get pregnant  -breast-feeding  How should I use this medicine?  Take this medicine by mouth with a full glass of water. Follow the directions on the prescription label. Take at the start of a meal. Do not crush or chew. If the tablet has a score line, you may cut it in half at the score line for easier swallowing. Take your medicine at regular intervals. Do not take your medicine more often than directed. Take all of your medicine as directed even if you think you are better. Do not skip doses or stop your medicine early.  Talk to your pediatrician regarding the use of this medicine in children. Special care may be needed.  Overdosage: If you think you have taken too much of this medicine contact a poison control center or emergency room at once.  NOTE: This medicine is only for you. Do not share this medicine with others.  What if I miss a dose?  If you miss a dose, take it as soon as you can. If it is almost time for your next dose, take only that dose. Do not take double or extra doses.  What may interact with this medicine?  -allopurinol  -anticoagulants  -birth control pills  -methotrexate  -probenecid  This list may not describe all possible  interactions. Give your health care provider a list of all the medicines, herbs, non-prescription drugs, or dietary supplements you use. Also tell them if you smoke, drink alcohol, or use illegal drugs. Some items may interact with your medicine.  What should I watch for while using this medicine?  Tell your doctor or health care professional if your symptoms do not improve.  Do not treat diarrhea with over the counter products. Contact your doctor if you have diarrhea that lasts more than 2 days or if it is severe and watery.  If you have diabetes, you may get a false-positive result for sugar in your urine. Check with your doctor or health care professional.  Birth control pills may not work properly while you are taking this medicine. Talk to your doctor about using an extra method of birth control.  What side effects may I notice from receiving this medicine?  Side effects that you should report to your doctor or health care professional as soon as possible:  -allergic reactions like skin rash, itching or hives, swelling of the face, lips, or tongue  -breathing problems  -dark urine  -fever or chills, sore throat  -redness, blistering, peeling or loosening of the skin, including inside the mouth  -seizures  -trouble passing urine or change in the amount of urine  -unusual bleeding, bruising  -unusually weak or tired  -white patches or sores in the mouth   or throat  Side effects that usually do not require medical attention (report to your doctor or health care professional if they continue or are bothersome):  -diarrhea  -dizziness  -headache  -nausea, vomiting  -stomach upset  -vaginal or anal irritation  This list may not describe all possible side effects. Call your doctor for medical advice about side effects. You may report side effects to FDA at 1-800-FDA-1088.  Where should I keep my medicine?  Keep out of the reach of children.  Store at room temperature below 25 degrees C (77 degrees F). Keep container  tightly closed. Throw away any unused medicine after the expiration date.  NOTE: This sheet is a summary. It may not cover all possible information. If you have questions about this medicine, talk to your doctor, pharmacist, or health care provider.     © 2016, Elsevier/Gold Standard. (2007-11-25 12:04:30)

## 2016-06-18 ENCOUNTER — Encounter: Payer: Self-pay | Admitting: Physician Assistant

## 2016-06-18 ENCOUNTER — Ambulatory Visit (INDEPENDENT_AMBULATORY_CARE_PROVIDER_SITE_OTHER): Payer: Medicare Other | Admitting: Physician Assistant

## 2016-06-18 DIAGNOSIS — Z0000 Encounter for general adult medical examination without abnormal findings: Secondary | ICD-10-CM | POA: Diagnosis not present

## 2016-06-18 DIAGNOSIS — Z23 Encounter for immunization: Secondary | ICD-10-CM | POA: Diagnosis not present

## 2016-06-18 NOTE — Patient Instructions (Signed)

## 2016-06-18 NOTE — Progress Notes (Signed)
[name], Female    DOB: [birth year], 82 y.o.   MRN: [id number] Visit Date: 06/18/2016  Today's Provider: Mar Daring, PA-C   Chief Complaint  Patient presents with  . Medicare Wellness   Subjective:    Annual wellness visit Amanda Robinson is a 82 y.o. female. She feels well. She reports exercising walking. She reports she is sleeping fairly well. 06/04/15 AWE 10/17/14 Mammogram-BI-RADS 1 -----------------------------------------------------------   Review of Systems  Constitutional: Positive for diaphoresis and fatigue.  HENT: Positive for hearing loss, sinus pressure, sneezing and tinnitus.   Eyes: Positive for photophobia.  Respiratory: Negative.   Cardiovascular: Negative.   Gastrointestinal: Positive for abdominal distention.  Endocrine: Negative.   Genitourinary: Negative.   Musculoskeletal: Positive for neck pain.  Skin: Negative.   Allergic/Immunologic: Negative.   Neurological: Positive for headaches.  Hematological: Negative.   Psychiatric/Behavioral: The patient is nervous/anxious.   All ROS are chronic with exception of seasonal allergy symptoms, which are consistent with symptoms she has this time of year.  Social History   Social History  . Marital status: Married    Spouse name: N/A  . Number of children: 1  . Years of education: N/A   Occupational History  . Not on file.   Social History Main Topics  . Smoking status: Never Smoker  . Smokeless tobacco: Never Used  . Alcohol use No  . Drug use: No  . Sexual activity: Not on file   Other Topics Concern  . Not on file   Social History Narrative  . No narrative on file    History reviewed. No pertinent past medical history.   Patient Active Problem List   Diagnosis Date Noted  . Osteopenia 06/04/2015  . Atherosclerosis of coronary artery 04/17/2015  . Colon polyp 04/17/2015  . Clinical depression 04/17/2015  . Acid reflux 04/17/2015  . Blood in the  urine 04/17/2015  . HLD (hyperlipidemia) 04/17/2015  . Cannot sleep 04/17/2015  . 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 Avitaminosis D 04/17/2015  . Anxiety 04/05/2015  . Basal cell carcinoma of nose 02/16/2006    Past Surgical History:  Procedure Laterality Date  . ABDOMINAL HYSTERECTOMY  1963  . BREAST SURGERY Bilateral 1985   Biopsy  . CHOLECYSTECTOMY  03/1998  . 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 PARTIAL COLECTOMY  1963    Her family history includes Alcohol abuse in her brother; Alzheimer's disease in her father; Brain cancer in her sister; Colon cancer in her other; Esophageal cancer in her mother; Ovarian cancer in her other; Parkinsonism in her brother.    Current Meds  Medication Sig  . cholecalciferol (VITAMIN D) 1000 UNITS tablet Take 1,000 Units by mouth daily.  . Doxylamine Succinate, Sleep, (SLEEP AID PO) Take by mouth.  . pantoprazole (PROTONIX) 40 MG tablet Take 1 tablet (40 mg total) by mouth daily.  Marland Kitchen PARoxetine (PAXIL) 20 MG tablet Take 1 tablet (20 mg total) by mouth daily.  . simvastatin (ZOCOR) 20 MG tablet Take 1 tablet (20 mg total) by mouth daily.    Patient Care Team: Mar Daring, PA-C as PCP - General (Family Medicine)    Objective:   Vitals: There were no vitals taken for this visit.  Physical Exam  Constitutional: She is oriented to person, place, and time. She appears well-developed and well-nourished. No distress.  HENT:  Head: Normocephalic and atraumatic.  Right Ear: External ear normal.  Left Ear: External ear normal.  Nose: Nose normal.  Mouth/Throat: Uvula is midline, oropharynx is clear and moist and mucous membranes are normal. No oropharyngeal exudate.  Hearing aids in place  Eyes: Conjunctivae and EOM are normal. Pupils are equal, round, and reactive to light. Right eye exhibits no discharge. Left eye exhibits no discharge. No scleral icterus.  Neck: Normal range of motion. Neck supple. No JVD present. Carotid  bruit is not present. No tracheal deviation present. No thyromegaly present.  Cardiovascular: Normal rate, regular rhythm, normal heart sounds and intact distal pulses.  Exam reveals no gallop and no friction rub.   No murmur heard. Pulmonary/Chest: Effort normal and breath sounds normal. No respiratory distress. She has no wheezes. She has no rales. She exhibits no tenderness.  Abdominal: Soft. Bowel sounds are normal. She exhibits no distension and no mass. There is no tenderness. There is no rebound and no guarding.  Musculoskeletal: Normal range of motion. She exhibits no edema or tenderness.  Lymphadenopathy:    She has no cervical adenopathy.  Neurological: She is alert and oriented to person, place, and time.  Skin: Skin is warm and dry. No rash noted. She is not diaphoretic.  Psychiatric: She has a normal mood and affect. Her behavior is normal. Judgment and thought content normal.  Vitals reviewed.   Activities of Daily Living In your present state of health, do you have any difficulty performing the following activities: 06/18/2016  Hearing? Y  Vision? Y  Difficulty concentrating or making decisions? N  Walking or climbing stairs? N  Dressing or bathing? N  Doing errands, shopping? N  Some recent data might be hidden    Fall Risk Assessment Fall Risk  06/18/2016 06/04/2015 04/17/2015  Falls in the past year? No No No     Depression Screen PHQ 2/9 Scores 06/18/2016 06/04/2015 04/17/2015  PHQ - 2 Score 0 0 1  Exception Documentation - - Medical reason    Cognitive Testing - 6-CIT  Correct? Score   What year is it? yes 0 0 or 4  What month is it? yes 0 0 or 3  Memorize:    Amanda Robinson,  44,  [address]  [place]manda Robinson,      What time is it? (within 1 hour) yes 0 0 or 3  Count backwards from 20 yes 0 0, 2, or 4  Name the months of the year no 1 0, 2, or 4  Repeat name & address above no 4 0, 2, 4, 6, 8, or 10       TOTAL SCORE  5/28   Interpretation:  Normal  Normal (0-7)  Abnormal (8-28)       Assessment & Plan:     Annual Wellness Visit  Reviewed patient's Family Medical History Reviewed and updated list of patient's medical providers Assessment of cognitive impairment was done Assessed patient's functional ability Established a written schedule for health screening [place] Completed and Reviewed  Exercise Activities and Dietary recommendations Goals    . Exercise 150 minutes per week (moderate activity)       Immunization History  Administered Date(s) Administered  . Influenza, High Dose Seasonal PF 06/04/2015  . Pneumococcal Conjugate-13 01/01/2015  . Pneumococcal Polysaccharide-23 07/09/2006  . Td 07/09/2006  . Tdap 06/23/2011    Health Maintenance  Topic Date Due  . ZOSTAVAX  07/19/1984  . DEXA SCAN  07/19/1989  . INFLUENZA VACCINE  04/15/2016  . TETANUS/TDAP  06/22/2021  . PNA  vac Low Risk Adult  Completed      Discussed health benefits of physical activity, and encouraged her to engage in regular exercise appropriate for her age and condition.   1. Medicare annual wellness visit, subsequent Normal physical exam today. She is to call if she has any acute issues in the meantime. I will see her back in May 2018 for labs.  2. Need for influenza vaccination Flu vaccine given today without complication. Patient sat upright for 15 minutes to check for adverse reaction before being released. - Flu vaccine HIGH DOSE PF  ------------------------------------------------------------------------------------------------------------    Mar Daring, PA-C  [facility]

## 2016-09-22 ENCOUNTER — Encounter: Payer: Self-pay | Admitting: Physician Assistant

## 2016-09-22 ENCOUNTER — Ambulatory Visit (INDEPENDENT_AMBULATORY_CARE_PROVIDER_SITE_OTHER): Payer: Medicare HMO | Admitting: Physician Assistant

## 2016-09-22 VITALS — BP 138/82 | HR 84 | Temp 98.1°F | Resp 16 | Wt 122.0 lb

## 2016-09-22 DIAGNOSIS — E7800 Pure hypercholesterolemia, unspecified: Secondary | ICD-10-CM

## 2016-09-22 DIAGNOSIS — K219 Gastro-esophageal reflux disease without esophagitis: Secondary | ICD-10-CM

## 2016-09-22 DIAGNOSIS — F411 Generalized anxiety disorder: Secondary | ICD-10-CM

## 2016-09-22 DIAGNOSIS — R69 Illness, unspecified: Secondary | ICD-10-CM | POA: Diagnosis not present

## 2016-09-22 MED ORDER — PANTOPRAZOLE SODIUM 40 MG PO TBEC: 40.0000 mg | DELAYED_RELEASE_TABLET | Freq: Every day | ORAL | 3 refills | Status: DC

## 2016-09-22 MED ORDER — PAROXETINE HCL 20 MG PO TABS
20.0000 mg | ORAL_TABLET | Freq: Every day | ORAL | 3 refills | Status: DC
Start: 2016-09-22 — End: 2017-09-12

## 2016-09-22 MED ORDER — SIMVASTATIN 20 MG PO TABS: 20.0000 mg | ORAL_TABLET | Freq: Every day | ORAL | 3 refills | Status: DC

## 2016-09-22 NOTE — Progress Notes (Signed)
[name] Female    DOB: 06/25/1922   83 y.o.   MRN: [id number] Visit Date: 09/22/2016  Today's Provider: Mar Daring, PA-C   Chief Complaint  Patient presents with  . Insomnia  . Anxiety  . Hyperlipidemia   Subjective:    HPI  Follow up for Insomnia  The patient was last seen for this 1 years ago. Changes made at last visit include no changes.  She reports excellent compliance with treatment. She feels that condition is Improved. She is not having side effects.   ------------------------------------------------------------------------------------   Follow up for Anxiety  The patient was last seen for this 8 months ago. Changes made at last visit include no changes.  She reports excellent compliance with treatment. She feels that condition is Improved. She is not having side effects.   ------------------------------------------------------------------------------------     Lipid/Cholesterol, Follow-up:   Last seen for this8 months ago.  Management changes since that visit include labs checked. . Last Lipid Panel:    Component Value Date/Time   CHOL 206 (H) 01/21/2016 0853   TRIG 113 01/21/2016 0853   HDL 73 01/21/2016 0853   CHOLHDL 2.8 01/21/2016 0853   LDLCALC 110 (H) 01/21/2016 0853    Risk factors for vascular disease include hypercholesterolemia  She reports excellent compliance with treatment. She is not having side effects.  Current symptoms include none and have been stable. Weight trend: stable Prior visit with dietician: no Current diet: in general, a "healthy" diet   Current exercise: walking  Wt Readings from Last 3 Encounters:  09/22/16 122 lb (55.3 kg)  03/27/16 119 lb 6.4 oz (54.2 kg)  03/17/16 118 lb 9.6 oz (53.8 kg)  -------------------------------------------------------------------       Allergies  Allergen Reactions  . Clarithromycin   . Colesevelam Hcl     Burns stomach.  .  Propoxyphene      Current Outpatient Prescriptions:  .  Doxylamine Succinate, Sleep, (SLEEP AID PO), Take by mouth. 1/2 tablet at bedtime, Disp: , Rfl:  .  pantoprazole (PROTONIX) 40 MG tablet, Take 1 tablet (40 mg total) by mouth daily., Disp: 90 tablet, Rfl: 3 .  PARoxetine (PAXIL) 20 MG tablet, Take 1 tablet (20 mg total) by mouth daily., Disp: 90 tablet, Rfl: 3 .  simvastatin (ZOCOR) 20 MG tablet, Take 1 tablet (20 mg total) by mouth daily., Disp: 90 tablet, Rfl: 3  Review of Systems  Constitutional: Negative.   Respiratory: Negative.   Cardiovascular: Negative.   Gastrointestinal: Negative.   Neurological: Negative.   Psychiatric/Behavioral: Negative.     Social History  Substance Use Topics  . Smoking status: Never Smoker  . Smokeless tobacco: Never Used  . Alcohol use No   Objective:   BP 138/82 (BP Location: Left Arm, Patient Position: Sitting, Cuff Size: Normal)   Pulse 84   Temp 98.1 F (36.7 C) (Oral)   Resp 16   Wt 122 lb (55.3 kg)   BMI 21.61 kg/m   Physical Exam  Constitutional: She appears well-developed and well-nourished. No distress.  Neck: Normal range of motion. Neck supple. No tracheal deviation present. No thyromegaly present.  Cardiovascular: Normal rate, regular rhythm and normal heart sounds.  Exam reveals no gallop and no friction rub.   No murmur heard. Pulmonary/Chest: Effort normal and breath sounds normal. No respiratory distress. She has no wheezes. She has no rales.  Lymphadenopathy:    She has no cervical adenopathy.  Skin: She is  not diaphoretic.  Psychiatric: She has a normal mood and affect. Her behavior is normal. Judgment and thought content normal.  Vitals reviewed.      Assessment & Plan:     1. Gastroesophageal reflux disease, esophagitis presence not specified Stable. Diagnosis pulled for medication refill. Continue current medical treatment plan. - pantoprazole (PROTONIX) 40 MG tablet; Take 1 tablet (40 mg total) by mouth  daily.  Dispense: 90 tablet; Refill: 3  2. GAD (generalized anxiety disorder) Stable. Diagnosis pulled for medication refill. Continue current medical treatment plan. - PARoxetine (PAXIL) 20 MG tablet; Take 1 tablet (20 mg total) by mouth daily.  Dispense: 90 tablet; Refill: 3  3. Hypercholesteremia Stable. Diagnosis pulled for medication refill. Continue current medical treatment plan. I will see her back in May 2018 for labs and f/u on chronic issues. - simvastatin (ZOCOR) 20 MG tablet; Take 1 tablet (20 mg total) by mouth daily.  Dispense: 90 tablet; Refill: [place], PA-C  [facility]

## 2017-01-19 DIAGNOSIS — H353212 Exudative age-related macular degeneration, right eye, with inactive choroidal neovascularization: Secondary | ICD-10-CM | POA: Diagnosis not present

## 2017-01-21 ENCOUNTER — Ambulatory Visit (INDEPENDENT_AMBULATORY_CARE_PROVIDER_SITE_OTHER): Payer: Medicare HMO | Admitting: Physician Assistant

## 2017-01-21 ENCOUNTER — Ambulatory Visit: Payer: Medicare HMO | Admitting: Physician Assistant

## 2017-01-21 ENCOUNTER — Encounter: Payer: Self-pay | Admitting: Physician Assistant

## 2017-01-21 VITALS — BP 132/60 | HR 75 | Temp 97.9°F | Wt 121.6 lb

## 2017-01-21 DIAGNOSIS — R030 Elevated blood-pressure reading, without diagnosis of hypertension: Secondary | ICD-10-CM | POA: Diagnosis not present

## 2017-01-21 DIAGNOSIS — E7800 Pure hypercholesterolemia, unspecified: Secondary | ICD-10-CM | POA: Diagnosis not present

## 2017-01-21 DIAGNOSIS — K219 Gastro-esophageal reflux disease without esophagitis: Secondary | ICD-10-CM | POA: Diagnosis not present

## 2017-01-21 NOTE — Progress Notes (Signed)
[name] Female    DOB: 05-Aug-1922   83 y.o.   MRN: [id number] Visit Date: 01/21/2017  Today's Provider: Mar Daring, PA-C   Chief Complaint  Patient presents with  . Anxiety  . Hyperlipidemia  . Gastroesophageal Reflux  . Follow-up   Subjective:    HPI  Lipid/Cholesterol, Follow-up:   Last seen for this 4 months ago.  Management changes since that visit include continue Simvastatin. . Last Lipid Panel:    Component Value Date/Time   CHOL 206 (H) 01/21/2016 0853   TRIG 113 01/21/2016 0853   HDL 73 01/21/2016 0853   CHOLHDL 2.8 01/21/2016 0853   LDLCALC 110 (H) 01/21/2016 0853    Risk factors for vascular disease include hypercholesterolemia  She reports excellent compliance with treatment. She is not having side effects.  Current symptoms include none  Weight trend: stable Prior visit with dietician: no Current diet: in general, a "healthy" diet   Current exercise: walking  Wt Readings from Last 3 Encounters:  01/21/17 121 lb 9.6 oz (55.2 kg)  09/22/16 122 lb (55.3 kg)  03/27/16 119 lb 6.4 oz (54.2 kg)    -------------------------------------------------------------------             Anxiety Follow Up:  The patient was last seen for this 4 months ago. Changes made at last visit include no changes.  Continue medication.  She reports excellent compliance with treatment. She feels that condition is Improved. She is not having side effects.    GERD Follow Up:  The patient was last seen for this 4 months ago. Changes made at last visit include no changes. Continue medication.  She reports excellent compliance with treatment. She feels that condition is Improved. She is not having side effects.   Anxiety: She does report that she has been having some increased anxiety secondaqry to her husband's declining health. She reports he has been having increased back pain, random leg movements of his right leg and worsening dementia. He  did recently go to the [place] in [place] and have an injection for his back pain that is currently helping that. She gets nervous about his dementia, stating he forgets what you tell him within 5 minutes. She has had family members with it and so has her husband so she is worried about how he may progress. She feels this causes her BP to increase occasionally and also reports it is normally when they are out together. She does not check her BP at home regularly.   Previous Medications   DOXYLAMINE SUCCINATE, SLEEP, (SLEEP AID PO)    Take by mouth. 1/2 tablet at bedtime   PANTOPRAZOLE (PROTONIX) 40 MG TABLET    Take 1 tablet (40 mg total) by mouth daily.   PAROXETINE (PAXIL) 20 MG TABLET    Take 1 tablet (20 mg total) by mouth daily.   SIMVASTATIN (ZOCOR) 20 MG TABLET    Take 1 tablet (20 mg total) by mouth daily.    Review of Systems  Constitutional: Negative.   Respiratory: Negative.   Cardiovascular: Negative.   Gastrointestinal: Negative.   Musculoskeletal: Negative.   Neurological: Negative.   Psychiatric/Behavioral: The patient is nervous/anxious.     Social History  Substance Use Topics  . Smoking status: Never Smoker  . Smokeless tobacco: Never Used  . Alcohol use No   Objective:   BP 132/60 (BP Location: Right Arm, Patient Position: Sitting, Cuff Size: Normal)   Pulse 75   Temp 97.9 F (  36.6 C) (Oral)   Wt 121 lb 9.6 oz (55.2 kg)   SpO2 98%   BMI 21.54 kg/m   Physical Exam  Constitutional: She appears well-developed and well-nourished. No distress.  Neck: Normal range of motion. Neck supple. No JVD present. Carotid bruit is not present. No tracheal deviation present. No thyromegaly present.  Cardiovascular: Normal rate, regular rhythm and normal heart sounds.  Exam reveals no gallop and no friction rub.   No murmur heard. Pulmonary/Chest: Effort normal and breath sounds normal. No respiratory distress. She has no wheezes. She has no rales.  Lymphadenopathy:    She has no  cervical adenopathy.  Skin: She is not diaphoretic.  Vitals reviewed.      Assessment & Plan:     1. Pure hypercholesterolemia Stable. Continue simvastatin 20mg . Will check labs as below and f/u pending results. - CBC w/Diff/Platelet - Comprehensive Metabolic Panel (CMET) - Lipid Profile  2. Gastroesophageal reflux disease without esophagitis Stable. Continue protonix 40mg . Will check labs as below and f/u pending results. - CBC w/Diff/Platelet - Comprehensive Metabolic Panel (CMET)  3. Elevated blood pressure reading Will monitor BP readings at home and call if readings are consistently > 160/90. She voices understanding. Will check labs as below and f/u pending results. - CBC w/Diff/Platelet - Comprehensive Metabolic Panel (CMET) - Lipid Profile   Follow up: Return in about 5 months (around 06/23/2017) for AWV.

## 2017-01-21 NOTE — Patient Instructions (Signed)
DASH Eating Plan DASH stands for "Dietary Approaches to Stop Hypertension." The DASH eating plan is a healthy eating plan that has been shown to reduce high blood pressure (hypertension). It may also reduce your risk for type 2 diabetes, heart disease, and stroke. The DASH eating plan may also help with weight loss. What are tips for following this plan? General guidelines  Avoid eating more than 2,300 mg (milligrams) of salt (sodium) a day. If you have hypertension, you may need to reduce your sodium intake to 1,500 mg a day.  Limit alcohol intake to no more than 1 drink a day for nonpregnant women and 2 drinks a day for men. One drink equals 12 oz of beer, 5 oz of wine, or 1 oz of hard liquor.  Work with your health care provider to maintain a healthy body weight or to lose weight. Ask what an ideal weight is for you.  Get at least 30 minutes of exercise that causes your heart to beat faster (aerobic exercise) most days of the week. Activities may include walking, swimming, or biking.  Work with your health care provider or diet and nutrition specialist (dietitian) to adjust your eating plan to your individual calorie needs. Reading food labels  Check food labels for the amount of sodium per serving. Choose foods with less than 5 percent of the Daily Value of sodium. Generally, foods with less than 300 mg of sodium per serving fit into this eating plan.  To find whole grains, look for the word "whole" as the first word in the ingredient list. Shopping  Buy products labeled as "low-sodium" or "no salt added."  Buy fresh foods. Avoid canned foods and premade or frozen meals. Cooking  Avoid adding salt when cooking. Use salt-free seasonings or herbs instead of table salt or sea salt. Check with your health care provider or pharmacist before using salt substitutes.  Do not fry foods. Cook foods using healthy methods such as baking, boiling, grilling, and broiling instead.  Cook with  heart-healthy oils, such as olive, canola, soybean, or sunflower oil. Meal planning   Eat a balanced diet that includes: ? 5 or more servings of fruits and vegetables each day. At each meal, try to fill half of your plate with fruits and vegetables. ? Up to 6-8 servings of whole grains each day. ? Less than 6 oz of lean meat, poultry, or fish each day. A 3-oz serving of meat is about the same size as a deck of cards. One egg equals 1 oz. ? 2 servings of low-fat dairy each day. ? A serving of nuts, seeds, or beans 5 times each week. ? Heart-healthy fats. Healthy fats called Omega-3 fatty acids are found in foods such as flaxseeds and coldwater fish, like sardines, salmon, and mackerel.  Limit how much you eat of the following: ? Canned or prepackaged foods. ? Food that is high in trans fat, such as fried foods. ? Food that is high in saturated fat, such as fatty meat. ? Sweets, desserts, sugary drinks, and other foods with added sugar. ? Full-fat dairy products.  Do not salt foods before eating.  Try to eat at least 2 vegetarian meals each week.  Eat more home-cooked food and less restaurant, buffet, and fast food.  When eating at a restaurant, ask that your food be prepared with less salt or no salt, if possible. What foods are recommended? The items listed may not be a complete list. Talk with your dietitian about what   dietary choices are best for you. Grains Whole-grain or whole-wheat bread. Whole-grain or whole-wheat pasta. Brown rice. Oatmeal. Quinoa. Bulgur. Whole-grain and low-sodium cereals. Pita bread. Low-fat, low-sodium crackers. Whole-wheat flour tortillas. Vegetables Fresh or frozen vegetables (raw, steamed, roasted, or grilled). Low-sodium or reduced-sodium tomato and vegetable juice. Low-sodium or reduced-sodium tomato sauce and tomato paste. Low-sodium or reduced-sodium canned vegetables. Fruits All fresh, dried, or frozen fruit. Canned fruit in natural juice (without  added sugar). Meat and other protein foods Skinless chicken or turkey. Ground chicken or turkey. Pork with fat trimmed off. Fish and seafood. Egg whites. Dried beans, peas, or lentils. Unsalted nuts, nut butters, and seeds. Unsalted canned beans. Lean cuts of beef with fat trimmed off. Low-sodium, lean deli meat. Dairy Low-fat (1%) or fat-free (skim) milk. Fat-free, low-fat, or reduced-fat cheeses. Nonfat, low-sodium ricotta or cottage cheese. Low-fat or nonfat yogurt. Low-fat, low-sodium cheese. Fats and oils Soft margarine without trans fats. Vegetable oil. Low-fat, reduced-fat, or light mayonnaise and salad dressings (reduced-sodium). Canola, safflower, olive, soybean, and sunflower oils. Avocado. Seasoning and other foods Herbs. Spices. Seasoning mixes without salt. Unsalted popcorn and pretzels. Fat-free sweets. What foods are not recommended? The items listed may not be a complete list. Talk with your dietitian about what dietary choices are best for you. Grains Baked goods made with fat, such as croissants, muffins, or some breads. Dry pasta or rice meal packs. Vegetables Creamed or fried vegetables. Vegetables in a cheese sauce. Regular canned vegetables (not low-sodium or reduced-sodium). Regular canned tomato sauce and paste (not low-sodium or reduced-sodium). Regular tomato and vegetable juice (not low-sodium or reduced-sodium). Pickles. Olives. Fruits Canned fruit in a light or heavy syrup. Fried fruit. Fruit in cream or butter sauce. Meat and other protein foods Fatty cuts of meat. Ribs. Fried meat. Bacon. Sausage. Bologna and other processed lunch meats. Salami. Fatback. Hotdogs. Bratwurst. Salted nuts and seeds. Canned beans with added salt. Canned or smoked fish. Whole eggs or egg yolks. Chicken or turkey with skin. Dairy Whole or 2% milk, cream, and half-and-half. Whole or full-fat cream cheese. Whole-fat or sweetened yogurt. Full-fat cheese. Nondairy creamers. Whipped toppings.  Processed cheese and cheese spreads. Fats and oils Butter. Stick margarine. Lard. Shortening. Ghee. Bacon fat. Tropical oils, such as coconut, palm kernel, or palm oil. Seasoning and other foods Salted popcorn and pretzels. Onion salt, garlic salt, seasoned salt, table salt, and sea salt. Worcestershire sauce. Tartar sauce. Barbecue sauce. Teriyaki sauce. Soy sauce, including reduced-sodium. Steak sauce. Canned and packaged gravies. Fish sauce. Oyster sauce. Cocktail sauce. Horseradish that you find on the shelf. Ketchup. Mustard. Meat flavorings and tenderizers. Bouillon cubes. Hot sauce and Tabasco sauce. Premade or packaged marinades. Premade or packaged taco seasonings. Relishes. Regular salad dressings. Where to find more information:  National Heart, Lung, and Blood Institute: www.nhlbi.nih.gov  American Heart Association: www.heart.org Summary  The DASH eating plan is a healthy eating plan that has been shown to reduce high blood pressure (hypertension). It may also reduce your risk for type 2 diabetes, heart disease, and stroke.  With the DASH eating plan, you should limit salt (sodium) intake to 2,300 mg a day. If you have hypertension, you may need to reduce your sodium intake to 1,500 mg a day.  When on the DASH eating plan, aim to eat more fresh fruits and vegetables, whole grains, lean proteins, low-fat dairy, and heart-healthy fats.  Work with your health care provider or diet and nutrition specialist (dietitian) to adjust your eating plan to your individual   calorie needs. This information is not intended to replace advice given to you by your health care provider. Make sure you discuss any questions you have with your health care provider. Document Released: 08/21/2011 Document Revised: 08/25/2016 Document Reviewed: 08/25/2016 Elsevier Interactive Patient Education  2017 Elsevier Inc.  

## 2017-01-22 DIAGNOSIS — R030 Elevated blood-pressure reading, without diagnosis of hypertension: Secondary | ICD-10-CM | POA: Diagnosis not present

## 2017-01-22 DIAGNOSIS — E7800 Pure hypercholesterolemia, unspecified: Secondary | ICD-10-CM | POA: Diagnosis not present

## 2017-01-22 DIAGNOSIS — K219 Gastro-esophageal reflux disease without esophagitis: Secondary | ICD-10-CM | POA: Diagnosis not present

## 2017-01-23 ENCOUNTER — Telehealth: Payer: Self-pay

## 2017-01-23 LAB — CBC WITH DIFFERENTIAL/PLATELET
Basophils Absolute: 0 x10E3/uL (ref 0.0–0.2)
Basos: 1 %
EOS (ABSOLUTE): 0.1 x10E3/uL (ref 0.0–0.4)
Eos: 2 %
Hematocrit: 37.8 % (ref 34.0–46.6)
Hemoglobin: 12.1 g/dL (ref 11.1–15.9)
Immature Grans (Abs): 0 x10E3/uL (ref 0.0–0.1)
Immature Granulocytes: 0 %
Lymphocytes Absolute: 1.5 x10E3/uL (ref 0.7–3.1)
Lymphs: 24 %
MCH: 29.4 pg (ref 26.6–33.0)
MCHC: 32 g/dL (ref 31.5–35.7)
MCV: 92 fL (ref 79–97)
Monocytes Absolute: 0.5 x10E3/uL (ref 0.1–0.9)
Monocytes: 8 %
Neutrophils Absolute: 4.2 x10E3/uL (ref 1.4–7.0)
Neutrophils: 65 %
Platelets: 300 x10E3/uL (ref 150–379)
RBC: 4.11 x10E6/uL (ref 3.77–5.28)
RDW: 13.9 % (ref 12.3–15.4)
WBC: 6.4 x10E3/uL (ref 3.4–10.8)

## 2017-01-23 LAB — COMPREHENSIVE METABOLIC PANEL
ALT: 10 IU/L (ref 0–32)
AST: 16 IU/L (ref 0–40)
Albumin/Globulin Ratio: 1.2 (ref 1.2–2.2)
Albumin: 4 g/dL (ref 3.2–4.6)
Alkaline Phosphatase: 89 IU/L (ref 39–117)
BILIRUBIN TOTAL: 0.6 mg/dL (ref 0.0–1.2)
BUN/Creatinine Ratio: 17 (ref 12–28)
BUN: 21 mg/dL (ref 10–36)
CHLORIDE: 101 mmol/L (ref 96–106)
CO2: 25 mmol/L (ref 18–29)
CREATININE: 1.25 mg/dL — AB (ref 0.57–1.00)
Calcium: 9.5 mg/dL (ref 8.7–10.3)
GFR calc Af Amer: 43 mL/min/{1.73_m2} — ABNORMAL LOW (ref >59)
GFR calc non Af Amer: 37 mL/min/{1.73_m2} — ABNORMAL LOW (ref >59)
GLUCOSE: 96 mg/dL (ref 65–99)
Globulin, Total: 3.4 g/dL (ref 1.5–4.5)
Potassium: 4.6 mmol/L (ref 3.5–5.2)
Sodium: 142 mmol/L (ref 134–144)
TOTAL PROTEIN: 7.4 g/dL (ref 6.0–8.5)

## 2017-01-23 LAB — LIPID PANEL
Chol/HDL Ratio: 2.8 ratio (ref 0.0–4.4)
Cholesterol, Total: 225 mg/dL — ABNORMAL HIGH (ref 100–199)
HDL: 79 mg/dL
LDL Calculated: 123 mg/dL — ABNORMAL HIGH (ref 0–99)
Triglycerides: 115 mg/dL (ref 0–149)
VLDL Cholesterol Cal: 23 mg/dL (ref 5–40)

## 2017-01-23 NOTE — Telephone Encounter (Signed)
Advised pt of lab results. Pt verbally acknowledges understanding. Amanda Robinson, CMA   

## 2017-01-23 NOTE — Telephone Encounter (Signed)
-----   Message from Mar Daring, [place] sent at 01/23/2017  1:57 PM EDT ----- Cholesterol up just slightly compared to labs last year but HDL (good) cholesterol still elevated offering cardioprotection. Kidney function slightly decreased from labs last year as well. This can sometimes be dehydration. Make sure to push fluids. We will monitor. All other labs are WNL and stable.

## 2017-01-26 DIAGNOSIS — H353212 Exudative age-related macular degeneration, right eye, with inactive choroidal neovascularization: Secondary | ICD-10-CM | POA: Diagnosis not present

## 2017-03-26 ENCOUNTER — Other Ambulatory Visit: Payer: Self-pay | Admitting: Ophthalmology

## 2017-03-26 DIAGNOSIS — G453 Amaurosis fugax: Secondary | ICD-10-CM | POA: Diagnosis not present

## 2017-03-26 DIAGNOSIS — H547 Unspecified visual loss: Secondary | ICD-10-CM

## 2017-03-26 DIAGNOSIS — H538 Other visual disturbances: Secondary | ICD-10-CM

## 2017-03-26 DIAGNOSIS — H2513 Age-related nuclear cataract, bilateral: Secondary | ICD-10-CM | POA: Diagnosis not present

## 2017-03-30 ENCOUNTER — Ambulatory Visit
Admission: RE | Admit: 2017-03-30 | Discharge: 2017-03-30 | Disposition: A | Discharge status: Home / Self Care | Payer: Medicare HMO | Source: Ambulatory Visit | Attending: Ophthalmology | Admitting: Ophthalmology

## 2017-03-30 DIAGNOSIS — I638 Other cerebral infarction: Secondary | ICD-10-CM | POA: Insufficient documentation

## 2017-03-30 DIAGNOSIS — I6782 Cerebral ischemia: Secondary | ICD-10-CM | POA: Insufficient documentation

## 2017-03-30 DIAGNOSIS — G453 Amaurosis fugax: Secondary | ICD-10-CM

## 2017-03-30 DIAGNOSIS — H547 Unspecified visual loss: Secondary | ICD-10-CM | POA: Insufficient documentation

## 2017-03-30 DIAGNOSIS — H538 Other visual disturbances: Secondary | ICD-10-CM | POA: Insufficient documentation

## 2017-03-30 DIAGNOSIS — R51 Headache: Secondary | ICD-10-CM | POA: Diagnosis not present

## 2017-03-30 LAB — POCT I-STAT CREATININE: CREATININE: 1.1 mg/dL — AB (ref 0.44–1.00)

## 2017-03-30 MED ORDER — GADOBENATE DIMEGLUMINE 529 MG/ML IV SOLN
10.0000 mL | Freq: Once | INTRAVENOUS | Status: AC | PRN
  Administered 2017-03-30: 6 mL via INTRAVENOUS

## 2017-04-01 ENCOUNTER — Encounter: Payer: Self-pay | Admitting: Physician Assistant

## 2017-04-01 ENCOUNTER — Ambulatory Visit (INDEPENDENT_AMBULATORY_CARE_PROVIDER_SITE_OTHER): Payer: Medicare HMO | Admitting: Physician Assistant

## 2017-04-01 VITALS — BP 140/80 | HR 60 | Temp 98.0°F | Resp 16 | Wt 122.2 lb

## 2017-04-01 DIAGNOSIS — G453 Amaurosis fugax: Secondary | ICD-10-CM | POA: Diagnosis not present

## 2017-04-01 DIAGNOSIS — I639 Cerebral infarction, unspecified: Secondary | ICD-10-CM

## 2017-04-01 DIAGNOSIS — R51 Headache: Secondary | ICD-10-CM

## 2017-04-01 DIAGNOSIS — H538 Other visual disturbances: Secondary | ICD-10-CM | POA: Diagnosis not present

## 2017-04-01 DIAGNOSIS — R519 Headache, unspecified: Secondary | ICD-10-CM

## 2017-04-01 MED ORDER — ASPIRIN EC 81 MG PO TBEC: 81.0000 mg | DELAYED_RELEASE_TABLET | Freq: Every day | ORAL | 0 refills | Status: DC

## 2017-04-01 NOTE — Progress Notes (Signed)
[name] Female    DOB: 1922-08-07   83 y.o.   MRN: [id number] Visit Date: 04/01/2017  Today's Provider: Mar Daring, PA-C   Chief Complaint  Patient presents with  . MRI Results   Subjective:    HPI Patient is here today to F/u on MRI that was ordered by the eye doctor.  MRI showed small acute and subacute right occipital lobe PCA infarcts. Mild chronic small vessel ischemic disease, progressed from 2006. She reports that sometimes she gets a headache with pain on the right side of her face or wakes up with the headache and pain on the right side of her face, when this happens she takes her husbands Arthritis Tylenol 620 mg and it helps some.She does have blurred vision in left eye and she went to see the eye doctor for this. She denies, chest pain, fatigue, light-headedness, weak, numbness, leg swelling. She reports she had been using an OTC drop for eye pressure that she as since stopped. She reports the symptoms she was having in the left eye have subsided. Her only complaint that may be caused from the acute infarcts is a slight aphasia with calling objects a different name than what they are.      Allergies  Allergen Reactions  . Clarithromycin   . Colesevelam Hcl     Burns stomach.  . Propoxyphene      Current Outpatient Prescriptions:  .  Doxylamine Succinate, Sleep, (SLEEP AID PO), Take by mouth. 1/2 tablet at bedtime, Disp: , Rfl:  .  pantoprazole (PROTONIX) 40 MG tablet, Take 1 tablet (40 mg total) by mouth daily., Disp: 90 tablet, Rfl: 3 .  PARoxetine (PAXIL) 20 MG tablet, Take 1 tablet (20 mg total) by mouth daily., Disp: 90 tablet, Rfl: 3 .  simvastatin (ZOCOR) 20 MG tablet, Take 1 tablet (20 mg total) by mouth daily., Disp: 90 tablet, Rfl: 3  Review of Systems  Constitutional: Negative for activity change, appetite change and fatigue.  HENT: Negative for congestion, ear discharge, ear pain, facial swelling, hearing loss, nosebleeds,  postnasal drip, rhinorrhea, sinus pain, tinnitus and trouble swallowing.   Eyes: Positive for visual disturbance (improving now). Negative for photophobia, pain, discharge, redness and itching.  Respiratory: Negative for cough, chest tightness and shortness of breath.   Cardiovascular: Negative for chest pain, palpitations and leg swelling.  Gastrointestinal: Negative for abdominal pain.  Neurological: Positive for headaches (sometimes). Negative for dizziness, tremors, seizures, syncope, facial asymmetry, speech difficulty, weakness, light-headedness and numbness.  Hematological: Negative.     Social History  Substance Use Topics  . Smoking status: Never Smoker  . Smokeless tobacco: Never Used  . Alcohol use No   Objective:   BP 140/80 (BP Location: Left Arm, Patient Position: Sitting, Cuff Size: Normal)   Pulse 60   Temp 98 F (36.7 C) (Oral)   Resp 16   Wt 122 lb 3.2 oz (55.4 kg)   BMI 21.65 kg/m    Physical Exam  Constitutional: She appears well-developed and well-nourished. No distress.  Eyes: Pupils are equal, round, and reactive to light. Conjunctivae and EOM are normal. Right eye exhibits no discharge. Left eye exhibits no discharge.  Neck: Normal range of motion. Neck supple. No JVD present. No tracheal deviation present. No thyromegaly present.  Cardiovascular: Normal rate, regular rhythm, normal heart sounds and intact distal pulses.  Exam reveals no gallop and no friction rub.   No murmur heard. Pulmonary/Chest: Effort  normal and breath sounds normal. No respiratory distress. She has no wheezes. She has no rales.  Musculoskeletal: She exhibits no edema.  Lymphadenopathy:    She has no cervical adenopathy.  Skin: She is not diaphoretic.  Vitals reviewed.  CLINICAL DATA:  Left eye blurry vision for several weeks. Episodic headache.  EXAM: MRI HEAD WITHOUT AND WITH CONTRAST  TECHNIQUE: Multiplanar, multiecho pulse sequences of the brain and  surrounding structures were obtained without and with intravenous contrast.  CONTRAST:  70mL MULTIHANCE GADOBENATE DIMEGLUMINE 529 MG/ML IV SOLN  COMPARISON:  01/13/2005  FINDINGS: Brain: There are small areas of predominantly cortically based diffusion abnormality in the right occipital lobe consistent with PCA infarcts. Some areas appear more acute with restricted diffusion, while others are more subacute in appearance with associated gyral enhancement. No intracranial hemorrhage, mass, midline shift, or extra-axial fluid collection is identified. Mild generalized cerebral atrophy is within normal limits for age. T2 hyperintensities in the cerebral white matter bilaterally and in the pons of progressed from 2006 and are nonspecific but compatible with chronic small vessel ischemic disease, relatively mild for age. There are chronic lacunar infarcts in the left caudate and right thalamus.  Vascular: Major intracranial vascular flow voids are preserved.  Skull and upper cervical spine: Unremarkable bone marrow signal.  Sinuses/Orbits: Unremarkable orbits. Trace right mastoid effusion. Moderate volume right sphenoid sinus fluid.  Other: None.  IMPRESSION: 1. 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 2. Mild chronic small vessel ischemic disease, progressed from 2006.   Electronically Signed   By: Logan Bores M.D.   On: 03/30/2017 13:30     Assessment & Plan:     1. Occipital infarction ([place]) Noted PCA infarcts in right occipital lobe in acute and subacute appearance. Will order CTA nexk and echocardiogram with bubble study for further evaluation and attempt to find source. She is to start a low dose 81mg  ASA. Continue Simvastatin. I will f/u pending results. She is to call if she develops any visual changes, speech or swallowing changes, numbness or weakness on one side of the body or the other.  - CT ANGIO NECK W OR WO CONTRAST; Future -  ECHOCARDIOGRAM LIMITED BUBBLE STUDY; Future  2. Right-sided headache Unsure if from the infarcts noted. Patient reports she has had these headaches for a long time now. Reports these more so involve the nose and right maxillary sinus. They respond to tylenol.  - CT ANGIO NECK W OR WO CONTRAST; Future - ECHOCARDIOGRAM LIMITED BUBBLE STUDY; Future  3. Blurred vision, left eye Improving since discontinuing eye drops. Eye exam was normal.  - CT ANGIO NECK W OR WO CONTRAST; Future - ECHOCARDIOGRAM LIMITED BUBBLE STUDY; Future  4. Amaurosis fugax of left eye Patient had described a blurring and darkening of the vision of the left eye that would come and go. Ophthalmic exam was normal. Symptoms improved since stopping eye drops 2 days ago.  - CT ANGIO NECK W OR WO CONTRAST; Future - ECHOCARDIOGRAM LIMITED BUBBLE STUDY; Future       Mar Daring, PA-C  [facility]

## 2017-04-13 ENCOUNTER — Ambulatory Visit
Admission: RE | Admit: 2017-04-13 | Discharge: 2017-04-13 | Disposition: A | Discharge status: Home / Self Care | Payer: Medicare HMO | Source: Ambulatory Visit | Attending: Physician Assistant | Admitting: Physician Assistant

## 2017-04-13 DIAGNOSIS — I639 Cerebral infarction, unspecified: Secondary | ICD-10-CM

## 2017-04-13 DIAGNOSIS — I6523 Occlusion and stenosis of bilateral carotid arteries: Secondary | ICD-10-CM | POA: Insufficient documentation

## 2017-04-13 DIAGNOSIS — I080 Rheumatic disorders of both mitral and aortic valves: Secondary | ICD-10-CM | POA: Insufficient documentation

## 2017-04-13 DIAGNOSIS — G453 Amaurosis fugax: Secondary | ICD-10-CM | POA: Diagnosis not present

## 2017-04-13 DIAGNOSIS — R51 Headache: Secondary | ICD-10-CM

## 2017-04-13 DIAGNOSIS — H538 Other visual disturbances: Secondary | ICD-10-CM

## 2017-04-13 DIAGNOSIS — I700 Atherosclerosis of aorta: Secondary | ICD-10-CM | POA: Diagnosis not present

## 2017-04-13 DIAGNOSIS — M47812 Spondylosis without myelopathy or radiculopathy, cervical region: Secondary | ICD-10-CM | POA: Diagnosis not present

## 2017-04-13 DIAGNOSIS — R519 Headache, unspecified: Secondary | ICD-10-CM

## 2017-04-13 LAB — POCT I-STAT CREATININE: CREATININE: 1.3 mg/dL — AB (ref 0.44–1.00)

## 2017-04-13 MED ORDER — IOPAMIDOL (ISOVUE-370) INJECTION 76%
60.0000 mL | Freq: Once | INTRAVENOUS | Status: AC | PRN
  Administered 2017-04-13: 60 mL via INTRAVENOUS

## 2017-04-13 NOTE — Progress Notes (Signed)
*  PRELIMINARY RESULTS* Echocardiogram 2D Echocardiogram has been performed.  Sherrie Sport 04/13/2017, 10:38 AM

## 2017-04-14 ENCOUNTER — Telehealth: Payer: Self-pay

## 2017-04-14 NOTE — Telephone Encounter (Signed)
Pt advised. She does not want any further work up a this time.

## 2017-04-14 NOTE — Telephone Encounter (Signed)
-----   Message from Mar Daring, [place] sent at 04/13/2017  5:33 PM EDT ----- CTA of head and neck was completely normal. There is some mild calcifications noted in the aorta and carotids but none that cause any significant stenosis. Unsure of what may have caused the occipital infarct. Would recommend neuro eval if patient desires further workup.  #See echo note as well; echo was normal, LVEF normal, no "hole in the heart".#

## 2017-04-14 NOTE — Telephone Encounter (Signed)
Pt advised and verbally expresses understanding.

## 2017-04-14 NOTE — Telephone Encounter (Signed)
-----   Message from Mar Daring, [place] sent at 04/13/2017  5:31 PM EDT ----- Echocardiogram showed normal LVEF and no left to right shunt (no hole in the heart). Normal study.

## 2017-06-23 ENCOUNTER — Ambulatory Visit (INDEPENDENT_AMBULATORY_CARE_PROVIDER_SITE_OTHER): Payer: Medicare HMO | Admitting: Physician Assistant

## 2017-06-23 ENCOUNTER — Ambulatory Visit: Payer: Medicare HMO

## 2017-06-23 ENCOUNTER — Encounter: Payer: Self-pay | Admitting: Physician Assistant

## 2017-06-23 VITALS — BP 138/70 | HR 79 | Temp 98.2°F | Resp 16 | Ht 63.0 in | Wt 122.4 lb

## 2017-06-23 DIAGNOSIS — Z0000 Encounter for general adult medical examination without abnormal findings: Secondary | ICD-10-CM

## 2017-06-23 DIAGNOSIS — Z23 Encounter for immunization: Secondary | ICD-10-CM

## 2017-06-23 DIAGNOSIS — E7800 Pure hypercholesterolemia, unspecified: Secondary | ICD-10-CM | POA: Diagnosis not present

## 2017-06-23 DIAGNOSIS — E559 Vitamin D deficiency, unspecified: Secondary | ICD-10-CM | POA: Diagnosis not present

## 2017-06-23 NOTE — Patient Instructions (Signed)
Health Maintenance for Postmenopausal Women Menopause is a normal process in which your reproductive ability comes to an end. This process happens gradually over a span of months to years, usually between the ages of 22 and 9. Menopause is complete when you have missed 12 consecutive menstrual periods. It is important to talk with your health care provider about some of the most common conditions that affect postmenopausal women, such as heart disease, cancer, and bone loss (osteoporosis). Adopting a healthy lifestyle and getting preventive care can help to promote your health and wellness. Those actions can also lower your chances of developing some of these common conditions. What should I know about menopause? During menopause, you may experience a number of symptoms, such as:  Moderate-to-severe hot flashes.  Night sweats.  Decrease in sex drive.  Mood swings.  Headaches.  Tiredness.  Irritability.  Memory problems.  Insomnia.  Choosing to treat or not to treat menopausal changes is an individual decision that you make with your health care provider. What should I know about hormone replacement therapy and supplements? Hormone therapy products are effective for treating symptoms that are associated with menopause, such as hot flashes and night sweats. Hormone replacement carries certain risks, especially as you become older. If you are thinking about using estrogen or estrogen with progestin treatments, discuss the benefits and risks with your health care provider. What should I know about heart disease and stroke? Heart disease, heart attack, and stroke become more likely as you age. This may be due, in part, to the hormonal changes that your body experiences during menopause. These can affect how your body processes dietary fats, triglycerides, and cholesterol. Heart attack and stroke are both medical emergencies. There are many things that you can do to help prevent heart disease  and stroke:  Have your blood pressure checked at least every 1-2 years. High blood pressure causes heart disease and increases the risk of stroke.  If you are 53-22 years old, ask your health care provider if you should take aspirin to prevent a heart attack or a stroke.  Do not use any tobacco products, including cigarettes, chewing tobacco, or electronic cigarettes. If you need help quitting, ask your health care provider.  It is important to eat a healthy diet and maintain a healthy weight. ? Be sure to include plenty of vegetables, fruits, low-fat dairy products, and lean protein. ? Avoid eating foods that are high in solid fats, added sugars, or salt (sodium).  Get regular exercise. This is one of the most important things that you can do for your health. ? Try to exercise for at least 150 minutes each week. The type of exercise that you do should increase your heart rate and make you sweat. This is known as moderate-intensity exercise. ? Try to do strengthening exercises at least twice each week. Do these in addition to the moderate-intensity exercise.  Know your numbers.Ask your health care provider to check your cholesterol and your blood glucose. Continue to have your blood tested as directed by your health care provider.  What should I know about cancer screening? There are several types of cancer. Take the following steps to reduce your risk and to catch any cancer development as early as possible. Breast Cancer  Practice breast self-awareness. ? This means understanding how your breasts normally appear and feel. ? It also means doing regular breast self-exams. Let your health care provider know about any changes, no matter how small.  If you are 40  or older, have a clinician do a breast exam (clinical breast exam or CBE) every year. Depending on your age, family history, and medical history, it may be recommended that you also have a yearly breast X-ray (mammogram).  If you  have a family history of breast cancer, talk with your health care provider about genetic screening.  If you are at high risk for breast cancer, talk with your health care provider about having an MRI and a mammogram every year.  Breast cancer (BRCA) gene test is recommended for women who have family members with BRCA-related cancers. Results of the assessment will determine the need for genetic counseling and BRCA1 and for BRCA2 testing. BRCA-related cancers include these types: ? Breast. This occurs in males or females. ? Ovarian. ? Tubal. This may also be called fallopian tube cancer. ? Cancer of the abdominal or pelvic lining (peritoneal cancer). ? Prostate. ? Pancreatic.  Cervical, Uterine, and Ovarian Cancer Your health care provider may recommend that you be screened regularly for cancer of the pelvic organs. These include your ovaries, uterus, and vagina. This screening involves a pelvic exam, which includes checking for microscopic changes to the surface of your cervix (Pap test).  For women ages 21-65, health care providers may recommend a pelvic exam and a Pap test every three years. For women ages 79-65, they may recommend the Pap test and pelvic exam, combined with testing for human papilloma virus (HPV), every five years. Some types of HPV increase your risk of cervical cancer. Testing for HPV may also be done on women of any age who have unclear Pap test results.  Other health care providers may not recommend any screening for nonpregnant women who are considered low risk for pelvic cancer and have no symptoms. Ask your health care provider if a screening pelvic exam is right for you.  If you have had past treatment for cervical cancer or a condition that could lead to cancer, you need Pap tests and screening for cancer for at least 20 years after your treatment. If Pap tests have been discontinued for you, your risk factors (such as having a new sexual partner) need to be  reassessed to determine if you should start having screenings again. Some women have medical problems that increase the chance of getting cervical cancer. In these cases, your health care provider may recommend that you have screening and Pap tests more often.  If you have a family history of uterine cancer or ovarian cancer, talk with your health care provider about genetic screening.  If you have vaginal bleeding after reaching menopause, tell your health care provider.  There are currently no reliable tests available to screen for ovarian cancer.  Lung Cancer Lung cancer screening is recommended for adults 69-62 years old who are at high risk for lung cancer because of a history of smoking. A yearly low-dose CT scan of the lungs is recommended if you:  Currently smoke.  Have a history of at least 30 pack-years of smoking and you currently smoke or have quit within the past 15 years. A pack-year is smoking an average of one pack of cigarettes per day for one year.  Yearly screening should:  Continue until it has been 15 years since you quit.  Stop if you develop a health problem that would prevent you from having lung cancer treatment.  Colorectal Cancer  This type of cancer can be detected and can often be prevented.  Routine colorectal cancer screening usually begins at  age 42 and continues through age 45.  If you have risk factors for colon cancer, your health care provider may recommend that you be screened at an earlier age.  If you have a family history of colorectal cancer, talk with your health care provider about genetic screening.  Your health care provider may also recommend using home test kits to check for hidden blood in your stool.  A small camera at the end of a tube can be used to examine your colon directly (sigmoidoscopy or colonoscopy). This is done to check for the earliest forms of colorectal cancer.  Direct examination of the colon should be repeated every  5-10 years until age 71. However, if early forms of precancerous polyps or small growths are found or if you have a family history or genetic risk for colorectal cancer, you may need to be screened more often.  Skin Cancer  Check your skin from head to toe regularly.  Monitor any moles. Be sure to tell your health care provider: ? About any new moles or changes in moles, especially if there is a change in a mole's shape or color. ? If you have a mole that is larger than the size of a pencil eraser.  If any of your family members has a history of skin cancer, especially at a young age, talk with your health care provider about genetic screening.  Always use sunscreen. Apply sunscreen liberally and repeatedly throughout the day.  Whenever you are outside, protect yourself by wearing long sleeves, pants, a wide-brimmed hat, and sunglasses.  What should I know about osteoporosis? Osteoporosis is a condition in which bone destruction happens more quickly than new bone creation. After menopause, you may be at an increased risk for osteoporosis. To help prevent osteoporosis or the bone fractures that can happen because of osteoporosis, the following is recommended:  If you are 46-71 years old, get at least 1,000 mg of calcium and at least 600 mg of vitamin D per day.  If you are older than age 55 but younger than age 65, get at least 1,200 mg of calcium and at least 600 mg of vitamin D per day.  If you are older than age 54, get at least 1,200 mg of calcium and at least 800 mg of vitamin D per day.  Smoking and excessive alcohol intake increase the risk of osteoporosis. Eat foods that are rich in calcium and vitamin D, and do weight-bearing exercises several times each week as directed by your health care provider. What should I know about how menopause affects my mental health? Depression may occur at any age, but it is more common as you become older. Common symptoms of depression  include:  Low or sad mood.  Changes in sleep patterns.  Changes in appetite or eating patterns.  Feeling an overall lack of motivation or enjoyment of activities that you previously enjoyed.  Frequent crying spells.  Talk with your health care provider if you think that you are experiencing depression. What should I know about immunizations? It is important that you get and maintain your immunizations. These include:  Tetanus, diphtheria, and pertussis (Tdap) booster vaccine.  Influenza every year before the flu season begins.  Pneumonia vaccine.  Shingles vaccine.  Your health care provider may also recommend other immunizations. This information is not intended to replace advice given to you by your health care provider. Make sure you discuss any questions you have with your health care provider. Document Released: 10/24/2005  Document Revised: 03/21/2016 Document Reviewed: 06/05/2015 Elsevier Interactive Patient Education  2018 [employer]  

## 2017-06-23 NOTE — Progress Notes (Signed)
[name], Female    DOB: Feb 15, 1922, 83 y.o.   MRN: [id number] Visit Date: 06/23/2017  Today's Provider: Mar Daring, PA-C   Chief Complaint  Patient presents with  . Medicare Wellness   Subjective:    Annual wellness visit Amanda Robinson is a 83 y.o. female. She feels well. She reports exercising walks a lot. She reports she is sleeping fairly well. She does not want mammograms any longer. She does still perform self breast exams and said she will call if she notices any changes. No family history of breast cancer.  -----------------------------------------------------------   Review of Systems  Constitutional: Negative.   HENT: Negative.   Eyes: Negative.   Respiratory: Negative.   Cardiovascular: Negative.   Gastrointestinal: Negative.   Endocrine: Negative.   Genitourinary: Negative.   Musculoskeletal: Negative.   Skin: Negative.   Allergic/Immunologic: Negative.   Neurological: Negative.   Hematological: Negative.   Psychiatric/Behavioral: Negative.     Social History   Social History  . Marital status: Married    Spouse name: N/A  . Number of children: 1  . Years of education: N/A   Occupational History  . Not on file.   Social History Main Topics  . Smoking status: Never Smoker  . Smokeless tobacco: Never Used  . Alcohol use No  . Drug use: No  . Sexual activity: Not on file   Other Topics Concern  . Not on file   Social History Narrative  . No narrative on file    No past medical history on file.   Patient Active Problem List   Diagnosis Date Noted  . Osteopenia 06/04/2015  . Atherosclerosis of coronary artery 04/17/2015  . Colon polyp 04/17/2015  . Clinical depression 04/17/2015  . Acid reflux 04/17/2015  . Blood in the urine 04/17/2015  . HLD (hyperlipidemia) 04/17/2015  . Cannot sleep 04/17/2015  . 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 Avitaminosis D 04/17/2015  . Anxiety 04/05/2015  . Basal cell carcinoma  of nose 02/16/2006    Past Surgical History:  Procedure Laterality Date  . ABDOMINAL HYSTERECTOMY  1963  . BREAST SURGERY Bilateral 1985   Biopsy  . CHOLECYSTECTOMY  03/1998  . 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 PARTIAL COLECTOMY  1963    Her family history includes Alcohol abuse in her brother; Alzheimer's disease in her father; Brain cancer in her sister; Colon cancer in her other; Esophageal cancer in her mother; Ovarian cancer in her other; Parkinsonism in her brother.      Current Outpatient Prescriptions:  .  aspirin EC 81 MG tablet, Take 1 tablet (81 mg total) by mouth daily., Disp: 30 tablet, Rfl: 0 .  pantoprazole (PROTONIX) 40 MG tablet, Take 1 tablet (40 mg total) by mouth daily., Disp: 90 tablet, Rfl: 3 .  PARoxetine (PAXIL) 20 MG tablet, Take 1 tablet (20 mg total) by mouth daily., Disp: 90 tablet, Rfl: 3 .  simvastatin (ZOCOR) 20 MG tablet, Take 1 tablet (20 mg total) by mouth daily., Disp: 90 tablet, Rfl: 3 .  Doxylamine Succinate, Sleep, (SLEEP AID PO), Take by mouth. 1/2 tablet at bedtime, Disp: , Rfl:   Patient Care Team: Amanda Daring, PA-C as PCP - General (Family Medicine)     Objective:   Vitals: BP 138/70 (BP Location: Left Arm, Patient Position: Sitting, Cuff Size: Normal)   Pulse 79   Temp 98.2 F (36.8 C) (Oral)  Resp 16   Ht 5\' 3"  (1.6 m)   Wt 122 lb 6.4 oz (55.5 kg)   SpO2 99%   BMI 21.68 kg/m   Physical Exam  Constitutional: She is oriented to person, place, and time. She appears well-developed and well-nourished. No distress.  HENT:  Head: Normocephalic and atraumatic.  Right Ear: Hearing, tympanic membrane, external ear and ear canal normal.  Left Ear: Hearing, tympanic membrane, external ear and ear canal normal.  Nose: Nose normal.  Mouth/Throat: Uvula is midline, oropharynx is clear and moist and mucous membranes are normal. No oropharyngeal exudate.  Eyes: Pupils are equal, round, and reactive to light.  Conjunctivae and EOM are normal. Right eye exhibits no discharge. Left eye exhibits no discharge. No scleral icterus.  Neck: Normal range of motion. Neck supple. No JVD present. Carotid bruit is not present. No tracheal deviation present. No thyromegaly present.  Cardiovascular: Normal rate, regular rhythm, normal heart sounds and intact distal pulses.  Exam reveals no gallop and no friction rub.   No murmur heard. Pulmonary/Chest: Effort normal and breath sounds normal. No respiratory distress. She has no wheezes. She has no rales. She exhibits no tenderness.  Abdominal: Soft. Bowel sounds are normal. She exhibits no distension and no mass. There is no tenderness. There is no rebound and no guarding.  Musculoskeletal: Normal range of motion. She exhibits no edema or tenderness.  Lymphadenopathy:    She has no cervical adenopathy.  Neurological: She is alert and oriented to person, place, and time. She has normal reflexes.  Skin: Skin is warm and dry. No rash noted. She is not diaphoretic.  Psychiatric: She has a normal mood and affect. Her behavior is normal. Judgment and thought content normal.  Vitals reviewed.   Activities of Daily Living In your present state of health, do you have any difficulty performing the following activities: 06/23/2017  Hearing? Y  Vision? N  Difficulty concentrating or making decisions? N  Walking or climbing stairs? N  Dressing or bathing? N  Doing errands, shopping? N  Some recent data might be hidden    Fall Risk Assessment Fall Risk  06/23/2017 06/18/2016 06/04/2015 04/17/2015  Falls in the past year? No No No No     Depression Screen PHQ 2/9 Scores 06/23/2017 06/18/2016 06/04/2015 04/17/2015  PHQ - 2 Score 1 0 0 1  PHQ- 9 Score 3 - - -  Exception Documentation - - - Medical reason    Cognitive Testing - 6-CIT  Correct? Score   What year is it? yes 0 0 or 4  What month is it? yes 0 0 or 3  Memorize:    Amanda Robinson,  44,  [address]  Amanda Robinson,        What time is it? (within 1 hour) yes 0 0 or 3  Count backwards from 20 no 2 0, 2, or 4  Name the months of the year yes 0 0, 2, or 4  Repeat name & address above no 3 0, 2, 4, 6, 8, or 10       TOTAL SCORE  05/28   Interpretation:  Normal  Normal (0-7) Abnormal (8-28)       Assessment & Plan:     Annual Wellness Visit  Reviewed patient's Family Medical History Reviewed and updated list of patient's medical providers Assessment of cognitive impairment was done Assessed patient's functional ability Established a written schedule for health screening [place] Completed and Reviewed  Exercise Activities  and Dietary recommendations Goals    . Exercise 150 minutes per week (moderate activity)       Immunization History  Administered Date(s) Administered  . Influenza, High Dose Seasonal PF 06/04/2015, 06/18/2016, 06/23/2017  . Pneumococcal Conjugate-13 01/01/2015  . Pneumococcal Polysaccharide-23 07/09/2006  . Td 07/09/2006  . Tdap 06/23/2011    Health Maintenance  Topic Date Due  . DEXA SCAN  07/19/1989  . INFLUENZA VACCINE  04/15/2017  . TETANUS/TDAP  06/22/2021  . PNA vac Low Risk Adult  Completed     Discussed health benefits of physical activity, and encouraged her to engage in regular exercise appropriate for her age and condition.    1. Medicare annual wellness visit, subsequent Normal physical exam. Up to date on all screenings and vaccinations.   2. Pure hypercholesterolemia Stable on Simvastatin 20mg . Will check labs as below and f/u pending results. - CBC w/Diff/Platelet - COMPLETE METABOLIC PANEL WITH GFR - Lipid Profile  3. Avitaminosis D Vit D was quite low last year at 41. Will recheck labs as below.  - CBC w/Diff/Platelet - COMPLETE METABOLIC PANEL WITH GFR - Vitamin D (25 hydroxy)  4. Influenza vaccine needed Flu vaccine given today without complication. Patient sat upright for 15 minutes to check for adverse reaction  before being released. - Flu vaccine HIGH DOSE PF  ------------------------------------------------------------------------------------------------------------    Amanda Daring, PA-C  [facility]

## 2017-06-29 DIAGNOSIS — E559 Vitamin D deficiency, unspecified: Secondary | ICD-10-CM | POA: Diagnosis not present

## 2017-06-29 DIAGNOSIS — E7800 Pure hypercholesterolemia, unspecified: Secondary | ICD-10-CM | POA: Diagnosis not present

## 2017-06-30 ENCOUNTER — Telehealth: Payer: Self-pay

## 2017-06-30 LAB — CBC WITH DIFFERENTIAL/PLATELET
BASOS ABS: 54 {cells}/uL (ref 0–200)
Basophils Relative: 0.8 %
EOS PCT: 2.3 %
Eosinophils Absolute: 154 cells/uL (ref 15–500)
HEMATOCRIT: 36.6 % (ref 35.0–45.0)
Hemoglobin: 12.2 g/dL (ref 11.7–15.5)
Lymphs Abs: 1997 cells/uL (ref 850–3900)
MCH: 29.7 pg (ref 27.0–33.0)
MCHC: 33.3 g/dL (ref 32.0–36.0)
MCV: 89.1 fL (ref 80.0–100.0)
MONOS PCT: 7.7 %
MPV: 10.9 fL (ref 7.5–12.5)
NEUTROS PCT: 59.4 %
Neutro Abs: 3980 cells/uL (ref 1500–7800)
PLATELETS: 279 10*3/uL (ref 140–400)
RBC: 4.11 10*6/uL (ref 3.80–5.10)
RDW: 12.4 % (ref 11.0–15.0)
TOTAL LYMPHOCYTE: 29.8 %
WBC mixed population: 516 cells/uL (ref 200–950)
WBC: 6.7 10*3/uL (ref 3.8–10.8)

## 2017-06-30 LAB — COMPLETE METABOLIC PANEL WITH GFR
AG Ratio: 1.1 (calc) (ref 1.0–2.5)
ALKALINE PHOSPHATASE (APISO): 81 U/L (ref 33–130)
ALT: 8 U/L (ref 6–29)
AST: 14 U/L (ref 10–35)
Albumin: 3.9 g/dL (ref 3.6–5.1)
BUN/Creatinine Ratio: 13 (calc) (ref 6–22)
BUN: 15 mg/dL (ref 7–25)
CHLORIDE: 107 mmol/L (ref 98–110)
CO2: 29 mmol/L (ref 20–32)
CREATININE: 1.15 mg/dL — AB (ref 0.60–0.88)
Calcium: 9.7 mg/dL (ref 8.6–10.4)
GFR, Est African American: 48 mL/min/{1.73_m2} — ABNORMAL LOW (ref >=60)
GFR, Est Non African American: 41 mL/min/{1.73_m2} — ABNORMAL LOW (ref >=60)
GLOBULIN: 3.5 g/dL (ref 1.9–3.7)
Glucose, Bld: 106 mg/dL — ABNORMAL HIGH (ref 65–99)
POTASSIUM: 4.5 mmol/L (ref 3.5–5.3)
SODIUM: 143 mmol/L (ref 135–146)
Total Bilirubin: 0.9 mg/dL (ref 0.2–1.2)
Total Protein: 7.4 g/dL (ref 6.1–8.1)

## 2017-06-30 LAB — VITAMIN D 25 HYDROXY (VIT D DEFICIENCY, FRACTURES): VIT D 25 HYDROXY: 16 ng/mL — AB (ref 30–100)

## 2017-06-30 LAB — LIPID PANEL
Cholesterol: 233 mg/dL — ABNORMAL HIGH (ref <200)
HDL: 74 mg/dL (ref >50)
LDL Cholesterol (Calc): 134 mg/dL (calc) — ABNORMAL HIGH
Non-HDL Cholesterol (Calc): 159 mg/dL (calc) — ABNORMAL HIGH (ref <130)
TRIGLYCERIDES: 137 mg/dL (ref <150)
Total CHOL/HDL Ratio: 3.1 (calc) (ref <5.0)

## 2017-06-30 MED ORDER — VITAMIN D (ERGOCALCIFEROL) 1.25 MG (50000 UNIT) PO CAPS: 50000.0000 [IU] | ORAL_CAPSULE | ORAL | 1 refills | Status: DC

## 2017-06-30 NOTE — Addendum Note (Signed)
Addended by: Mar Daring on: 06/30/2017 08:52 AM   Modules accepted: Orders

## 2017-06-30 NOTE — Telephone Encounter (Signed)
-----   Message from Mar Daring, [place] sent at 06/30/2017  8:51 AM EDT ----- Cholesterol up just slightly from last year but fairly stable. Renal function improved some as well. Continue staying well hydrated. Vit D very low at 16. Would recommend Rx strength Vit D supplementation that you take once weekly. I will send to pharmacy on file. ALl other labs are WNL and stable.

## 2017-06-30 NOTE — Telephone Encounter (Signed)
Patient advised as directed below.  Thanks,  -Joseline 

## 2017-09-12 ENCOUNTER — Other Ambulatory Visit: Payer: Self-pay | Admitting: Physician Assistant

## 2017-09-12 DIAGNOSIS — E7800 Pure hypercholesterolemia, unspecified: Secondary | ICD-10-CM

## 2017-09-12 DIAGNOSIS — F411 Generalized anxiety disorder: Secondary | ICD-10-CM

## 2017-09-12 DIAGNOSIS — K219 Gastro-esophageal reflux disease without esophagitis: Secondary | ICD-10-CM

## 2017-10-15 ENCOUNTER — Other Ambulatory Visit: Payer: Self-pay

## 2017-10-15 ENCOUNTER — Emergency Department: Payer: Medicare HMO

## 2017-10-15 ENCOUNTER — Ambulatory Visit (INDEPENDENT_AMBULATORY_CARE_PROVIDER_SITE_OTHER): Payer: Medicare HMO | Admitting: Physician Assistant

## 2017-10-15 ENCOUNTER — Encounter: Payer: Self-pay | Admitting: Emergency Medicine

## 2017-10-15 ENCOUNTER — Encounter: Payer: Self-pay | Admitting: Physician Assistant

## 2017-10-15 ENCOUNTER — Emergency Department
Admission: EM | Admit: 2017-10-15 | Discharge: 2017-10-15 | Disposition: A | Discharge status: Home / Self Care | Payer: Medicare HMO | Attending: Emergency Medicine | Admitting: Emergency Medicine

## 2017-10-15 VITALS — BP 126/80 | HR 130 | Temp 97.8°F | Resp 16 | Wt 120.0 lb

## 2017-10-15 DIAGNOSIS — R079 Chest pain, unspecified: Secondary | ICD-10-CM | POA: Diagnosis not present

## 2017-10-15 DIAGNOSIS — I248 Other forms of acute ischemic heart disease: Secondary | ICD-10-CM

## 2017-10-15 DIAGNOSIS — I4891 Unspecified atrial fibrillation: Secondary | ICD-10-CM | POA: Diagnosis not present

## 2017-10-15 DIAGNOSIS — I2510 Atherosclerotic heart disease of native coronary artery without angina pectoris: Secondary | ICD-10-CM | POA: Diagnosis not present

## 2017-10-15 DIAGNOSIS — R300 Dysuria: Secondary | ICD-10-CM

## 2017-10-15 DIAGNOSIS — I480 Paroxysmal atrial fibrillation: Secondary | ICD-10-CM | POA: Insufficient documentation

## 2017-10-15 DIAGNOSIS — R000 Tachycardia, unspecified: Secondary | ICD-10-CM

## 2017-10-15 DIAGNOSIS — Z7982 Long term (current) use of aspirin: Secondary | ICD-10-CM | POA: Insufficient documentation

## 2017-10-15 DIAGNOSIS — R350 Frequency of micturition: Secondary | ICD-10-CM | POA: Diagnosis not present

## 2017-10-15 DIAGNOSIS — Z79899 Other long term (current) drug therapy: Secondary | ICD-10-CM | POA: Diagnosis not present

## 2017-10-15 HISTORY — DX: Unspecified atrial fibrillation: I48.91

## 2017-10-15 HISTORY — DX: Atherosclerotic heart disease of native coronary artery without angina pectoris: I25.10

## 2017-10-15 LAB — BASIC METABOLIC PANEL
Anion gap: 16 — ABNORMAL HIGH (ref 5–15)
BUN: 22 mg/dL — ABNORMAL HIGH (ref 6–20)
CALCIUM: 9.8 mg/dL (ref 8.9–10.3)
CO2: 20 mmol/L — ABNORMAL LOW (ref 22–32)
Chloride: 102 mmol/L (ref 101–111)
Creatinine, Ser: 1.26 mg/dL — ABNORMAL HIGH (ref 0.44–1.00)
GFR calc Af Amer: 41 mL/min — ABNORMAL LOW (ref >60)
GFR, EST NON AFRICAN AMERICAN: 36 mL/min — AB (ref >60)
GLUCOSE: 110 mg/dL — AB (ref 65–99)
POTASSIUM: 3.2 mmol/L — AB (ref 3.5–5.1)
SODIUM: 138 mmol/L (ref 135–145)

## 2017-10-15 LAB — CBC
HEMATOCRIT: 41.5 % (ref 35.0–47.0)
HEMOGLOBIN: 13.4 g/dL (ref 12.0–16.0)
MCH: 29.6 pg (ref 26.0–34.0)
MCHC: 32.3 g/dL (ref 32.0–36.0)
MCV: 91.6 fL (ref 80.0–100.0)
Platelets: 243 10*3/uL (ref 150–440)
RBC: 4.53 MIL/uL (ref 3.80–5.20)
RDW: 13.6 % (ref 11.5–14.5)
WBC: 10.2 10*3/uL (ref 3.6–11.0)

## 2017-10-15 LAB — URINALYSIS, COMPLETE (UACMP) WITH MICROSCOPIC
BILIRUBIN URINE: NEGATIVE
Bacteria, UA: NONE SEEN
GLUCOSE, UA: NEGATIVE mg/dL
Ketones, ur: 5 mg/dL — AB
Leukocytes, UA: NEGATIVE
NITRITE: NEGATIVE
Protein, ur: NEGATIVE mg/dL
SPECIFIC GRAVITY, URINE: 1.004 — AB (ref 1.005–1.030)
Squamous Epithelial / LPF: NONE SEEN
pH: 7 (ref 5.0–8.0)

## 2017-10-15 LAB — TROPONIN I
Troponin I: 0.03 ng/mL (ref <0.03)
Troponin I: 0.03 ng/mL (ref <0.03)

## 2017-10-15 MED ORDER — METOPROLOL TARTRATE 25 MG PO TABS: 25.0000 mg | ORAL_TABLET | Freq: Two times a day (BID) | ORAL | 0 refills | Status: DC

## 2017-10-15 MED ORDER — ASPIRIN EC 81 MG PO TBEC: 81.0000 mg | DELAYED_RELEASE_TABLET | Freq: Every day | ORAL | 1 refills | Status: DC

## 2017-10-15 NOTE — Discharge Instructions (Signed)
Please seek medical attention for any high fevers, chest pain, shortness of breath, change in behavior, persistent vomiting, bloody stool or any other new or concerning symptoms.  

## 2017-10-15 NOTE — ED Provider Notes (Signed)
[facility] Emergency Department Provider Note   ____________________________________________   I have reviewed the triage vital signs and the nursing notes.   HISTORY  Chief Complaint Atrial Fibrillation   History limited by: Not Limited   HPI Amanda Robinson is a 84 y.o. female who presents to the emergency department today because of concerns for atrial fibrillation with RVR.  The patient actually went to her primary care physician's office today because of urinary symptoms.  States she has had some issues with the frequency.  When at the primary care doctor's office she was found to be tachycardic with an irregular rhythm.  Patient denies any history of atrial fibrillation.  She denies any chest pain.  She states she does not feel like her heart is racing.  She denies any recent fevers.    Per medical record review patient has a history of visit to PCP with diagnosis of afib.  Past Medical History:  Diagnosis Date  . Afib ([place])   . CAD (coronary artery disease)     Patient Active Problem List   Diagnosis Date Noted  . Osteopenia 06/04/2015  . Atherosclerosis of coronary artery 04/17/2015  . Colon polyp 04/17/2015  . Insomnia 04/17/2015  . Acid reflux 04/17/2015  . Blood in the urine 04/17/2015  . HLD (hyperlipidemia) 04/17/2015  . 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 Avitaminosis D 04/17/2015  . Anxiety 04/05/2015  . Basal cell carcinoma of nose 02/16/2006    Past Surgical History:  Procedure Laterality Date  . ABDOMINAL HYSTERECTOMY  1963  . BREAST SURGERY Bilateral 1985   Biopsy  . CHOLECYSTECTOMY  03/1998  . 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 PARTIAL COLECTOMY  1963    Prior to Admission medications   Medication Sig Start Date End Date Taking? Authorizing Provider  aspirin EC 81 MG tablet Take 1 tablet (81 mg total) by mouth daily. 04/01/17   Mar Daring, PA-C  pantoprazole (PROTONIX) 40 MG tablet TAKE 1  TABLET (40 MG TOTAL) BY MOUTH DAILY. 09/14/17   Mar Daring, PA-C  PARoxetine (PAXIL) 20 MG tablet TAKE 1 TABLET (20 MG TOTAL) BY MOUTH DAILY. 09/14/17   Mar Daring, PA-C  simvastatin (ZOCOR) 20 MG tablet TAKE 1 TABLET (20 MG TOTAL) BY MOUTH DAILY. 09/14/17   Mar Daring, PA-C  Vitamin D, Ergocalciferol, (DRISDOL) 50000 units CAPS capsule Take 1 capsule (50,000 Units total) by mouth every 7 (seven) days. 06/30/17   Mar Daring, PA-C    Allergies Clarithromycin; Colesevelam hcl; and Propoxyphene  Family History  Problem Relation Age of Onset  . Esophageal cancer Mother   . Alzheimer's disease Father   . Parkinsonism Brother   . Brain cancer Sister   . Alcohol abuse Brother   . Ovarian cancer Other   . Colon cancer Other     Social History Social History   Tobacco Use  . Smoking status: Never Smoker  . Smokeless tobacco: Never Used  Substance Use Topics  . Alcohol use: No  . Drug use: No    Review of Systems Constitutional: No fever/chills Eyes: No visual changes. ENT: No sore throat. Cardiovascular: Denies chest pain. Respiratory: Denies shortness of breath. Gastrointestinal: No abdominal pain.  No nausea, no vomiting.  No diarrhea.   Genitourinary: Positive for increased urinary frequency.  Musculoskeletal: Negative for back pain. Skin: Negative for rash. Neurological: Negative for headaches, focal weakness or numbness.  ____________________________________________   PHYSICAL EXAM:  VITAL SIGNS: ED Triage Vitals  Enc Vitals Group     BP 10/15/17 1239 (!) 171/96     Pulse Rate 10/15/17 1239 (!) 172     Resp 10/15/17 1239 18     Temp 10/15/17 1239 98 F (36.7 C)     Temp Source 10/15/17 1239 Oral     SpO2 10/15/17 1239 97 %     Weight 10/15/17 1240 120 lb (54.4 kg)     Height 10/15/17 1240 5\' 3"  (1.6 m)     Head Circumference --      Peak Flow --      Pain Score 10/15/17 1240 0   Constitutional: Alert and oriented.  Well appearing and in no distress. Eyes: Conjunctivae are normal.  ENT   Head: Normocephalic and atraumatic.   Nose: No congestion/rhinnorhea.   Mouth/Throat: Mucous membranes are moist.   Neck: No stridor. Hematological/Lymphatic/Immunilogical: No cervical lymphadenopathy. Cardiovascular: Normal rate, regular rhythm.  No murmurs, rubs, or gallops.  Respiratory: Normal respiratory effort without tachypnea nor retractions. Breath sounds are clear and equal bilaterally. No wheezes/rales/rhonchi. Gastrointestinal: Soft and non tender. No rebound. No guarding.  Genitourinary: Deferred Musculoskeletal: Normal range of motion in all extremities. No lower extremity edema. Neurologic:  Normal speech and language. No gross focal neurologic deficits are appreciated.  Skin:  Skin is warm, dry and intact. No rash noted. Psychiatric: Mood and affect are normal. Speech and behavior are normal. Patient exhibits appropriate insight and judgment.  ____________________________________________    LABS (pertinent positives/negatives)  Trop <0.03 -> 0.03 UA not consistent with infection CBC wnl BMP cr 1.26, k 3.2  ____________________________________________   EKG  I, Nance Pear, attending physician, personally viewed and interpreted this EKG  EKG Time: 1248 Rate: 129 Rhythm: afib with rvr Axis: left axis deviation Intervals: qtc 524 QRS: LVH ST changes: no st elevation Impression: abnormal ekg  I, Nance Pear, attending physician, personally viewed and interpreted this EKG  EKG Time: 1418 Rate: 86 Rhythm: normal sinus rhythm Axis: normal Intervals: qtc 490 QRS: narrow ST changes: st elevation avr Impression: abnormal ekg  ____________________________________________    RADIOLOGY  CXR No acute process  ____________________________________________   PROCEDURES  Procedures  ____________________________________________   INITIAL IMPRESSION /  ASSESSMENT AND PLAN / ED COURSE  Pertinent labs & imaging results that were available during my care of the patient were reviewed by me and considered in my medical decision making (see chart for details).  Patient presented because of concerns for new onset A. fib with RVR from primary care physician's office.  Prior to my evaluation she converted back to a sinus rhythm.  She denies any chest pain or shortness of breath.  Talked with Dr. Rockey Situ with cardiology.  He recommended repeat troponin.  This was elevated 0.03.  This was not a significant elevation did not change.  He did evaluate the EKG and does not think that she requires inpatient admission.  Patient felt comfortable with plan of going home.  She will follow-up with Dr. Kennyth Arnold.  Did start patient on metoprolol and baby aspirin per Dr. Donivan Scull recommendations.  ____________________________________________   FINAL CLINICAL IMPRESSION(S) / ED DIAGNOSES  Final diagnoses:  Paroxysmal atrial fibrillation [facility])     Note: This dictation was prepared with Dragon dictation. Any transcriptional errors that result from this process are unintentional     Nance Pear, MD 10/15/17 2006

## 2017-10-15 NOTE — ED Triage Notes (Signed)
Pt went to PMD today for not feeling her self and chest  Tightness. They sent her here because she was in AFIB with RVR.

## 2017-10-15 NOTE — ED Notes (Signed)
Patient transported to X-ray 

## 2017-10-15 NOTE — Progress Notes (Signed)
[name] Female    DOB: 10/10/1921   84 y.o.   MRN: [id number] Visit Date: 10/15/2017  Today's Provider: Mar Daring, PA-C   No chief complaint on file.  Subjective:    HPI Urinary Tract Infection: Patient complains of burning with urination She has had symptoms for 2 weeks. Patient also complains of no other symptom. Patient denies back pain, fever and vaginal discharge. Patient does not have a history of recurrent UTI.  Patient does not have a history of pyelonephritis.   Patient also complains of "hearing noises" and feels her hearing aids are causing her pick up other noises such as air blowing that is making her feel anxious. She has not slept well for the past 3 days. She states it was almost 3 am before she fell asleep last night.  On review of VS today patient was found to have an abnormal tachycardia of 130. She denies any chest pain or SOB, but states she has been feeling uneasy on her chest for the last couple of days which she equated to her anxiety with her hearing. She has no history of arrhythmia. She did have an occipital infarct July 2018. She is on Simvastatin 20mg  and ASA 81mg  daily for atherosclerosis and hypercholesterolemia.     Allergies  Allergen Reactions  . Clarithromycin   . Colesevelam Hcl     Burns stomach.  . Propoxyphene      Current Outpatient Medications:  .  aspirin EC 81 MG tablet, Take 1 tablet (81 mg total) by mouth daily., Disp: 30 tablet, Rfl: 0 .  pantoprazole (PROTONIX) 40 MG tablet, TAKE 1 TABLET (40 MG TOTAL) BY MOUTH DAILY., Disp: 90 tablet, Rfl: 3 .  PARoxetine (PAXIL) 20 MG tablet, TAKE 1 TABLET (20 MG TOTAL) BY MOUTH DAILY., Disp: 90 tablet, Rfl: 3 .  simvastatin (ZOCOR) 20 MG tablet, TAKE 1 TABLET (20 MG TOTAL) BY MOUTH DAILY., Disp: 90 tablet, Rfl: 3 .  Vitamin D, Ergocalciferol, (DRISDOL) 50000 units CAPS capsule, Take 1 capsule (50,000 Units total) by mouth every 7 (seven) days., Disp: 12 capsule, Rfl:  1  Review of Systems  Constitutional: Negative.   HENT: Positive for hearing loss and tinnitus. Negative for congestion, ear pain, postnasal drip, rhinorrhea, sinus pain, sore throat, trouble swallowing and voice change.   Respiratory: Negative.   Cardiovascular: Negative.   Gastrointestinal: Negative.   Genitourinary: Positive for dysuria.  Neurological: Negative.   Psychiatric/Behavioral: The patient is nervous/anxious.     Social History   Tobacco Use  . Smoking status: Never Smoker  . Smokeless tobacco: Never Used  Substance Use Topics  . Alcohol use: No   Objective:   BP 126/80 (BP Location: Left Arm, Patient Position: Sitting, Cuff Size: Normal)   Pulse (!) 130   Temp 97.8 F (36.6 C) (Oral)   Resp 16   Wt 120 lb (54.4 kg)   SpO2 97%   BMI 21.26 kg/m  Vitals:   10/15/17 1131  BP: 126/80  Pulse: (!) 130  Resp: 16  Temp: 97.8 F (36.6 C)  TempSrc: Oral  SpO2: 97%  Weight: 120 lb (54.4 kg)     Physical Exam  Constitutional: She appears well-developed and well-nourished. No distress.  Neck: Normal range of motion. Neck supple. JVD present. No tracheal deviation present. No thyromegaly present.  Cardiovascular: Normal heart sounds. An irregularly irregular rhythm present. Tachycardia present. Exam reveals no gallop and no friction rub.  No murmur  heard. Pulmonary/Chest: Effort normal and breath sounds normal. No respiratory distress. She has no wheezes. She has no rales.  Musculoskeletal: She exhibits no edema.  Lymphadenopathy:    She has no cervical adenopathy.  Skin: She is not diaphoretic.  Psychiatric: Her mood appears anxious.  Vitals reviewed.       Assessment & Plan:     1. New onset atrial fibrillation (HCC) EKG revealed a. Fib/flutter rate of 163 with LVH and demand ischemia in lateral leads. Due to acuity of EKG findings despite patient being asymptomatic except anxiousness and the hearing issues she was given 3-81mg  ASA orally and EMS was  called for emergent transport to [facility] for further evaluation.   2. Demand ischemia [facility]) See above medical treatment plan.  3. Tachycardia See above medical treatment plan. - EKG 12-Lead  4. Dysuria Unable to obtain urine specimen today. May need UA at hospital to make sure no infection. Patient afebrile today.        Mar Daring, PA-C  [facility]

## 2017-10-26 ENCOUNTER — Telehealth: Payer: Self-pay | Admitting: Physician Assistant

## 2017-10-26 DIAGNOSIS — F411 Generalized anxiety disorder: Secondary | ICD-10-CM

## 2017-10-26 MED ORDER — PAROXETINE HCL 20 MG PO TABS: 20.0000 mg | ORAL_TABLET | Freq: Every day | ORAL | 3 refills | Status: DC

## 2017-10-26 NOTE — Telephone Encounter (Signed)
Refill sent.

## 2017-10-26 NOTE — Telephone Encounter (Signed)
Patient is requesting a refill on the following medication  PARoxetine (PAXIL) 20 MG tablet   90 day supply  She uses [facility]

## 2017-11-22 DIAGNOSIS — I480 Paroxysmal atrial fibrillation: Secondary | ICD-10-CM | POA: Insufficient documentation

## 2017-11-22 NOTE — Progress Notes (Signed)
Cardiology Office Note  Date:  11/24/2017   ID:  Amanda, Robinson 10/06/1921, MRN [id number]  PCP:  Mar Daring, PA-C   Chief Complaint  Patient presents with  . OTHER    Afib. Meds reviewed verbally with pt.    HPI:  Amanda Robinson is a 84 y.o. female  H/a. Wakes at night with thrubbing Ears out of wack Occipital CVA workup 2018  went to her primary care physician's office 10/15/2017 because of urinary symptoms.   issues with the frequency.    found to be tachycardic with an irregular rhythm. Rate 160 bpm Felt to be in atrial fibrillation and sent to the ER She presents to establish care in the [place] office for follow-up of her paroxysmal atrial fibrillation   Patient denies any history of atrial fibrillation.  She denies any chest pain.  She states she does not feel like her heart is racing.  She denies any recent fevers.  Hospital records reviewed with the patient in detail HTN in the ER tnt neg CBC wnl BMP cr 1.26, k 3.2 EKG showed atrial fibrillation Follow up ekg with NSR Started on metoprolol at discharge from the hospital She does not why her potassium was so low  Lots of roaring and palpation in her head Dating back months to years Previous stroke with workup 2018  Echocardiogram at that time was unrevealing Was not taking any further  Some fatigue, last Wednesday had to sit for an hour Does not have been working blood pressure cuff to check heart rate or blood pressure Otherwise active at baseline with no symptoms of chest pain or shortness of breath  Echocardiogram July 2018 Normal LV function ejection fraction 55-60% normal left atrial size Negative bubble study RV normal size and function  EKG personally reviewed by myself on todays visit Shows sinus bradycardia rate 53 bpm nonspecific ST abnormality anterolateral leads  PMH:   has a past medical history of Afib ([place]), CAD (coronary artery disease), Chronic kidney disease, and Skin  cancer.  PSH:    Past Surgical History:  Procedure Laterality Date  . ABDOMINAL HYSTERECTOMY  1963  . BREAST SURGERY Bilateral 1985   Biopsy  . CHOLECYSTECTOMY  03/1998  . 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 PARTIAL COLECTOMY  1963    Current Outpatient Medications  Medication Sig Dispense Refill  . metoprolol tartrate (LOPRESSOR) 25 MG tablet Take 1 tablet (25 mg total) by mouth 2 (two) times daily. 60 tablet 0  . pantoprazole (PROTONIX) 40 MG tablet TAKE 1 TABLET (40 MG TOTAL) BY MOUTH DAILY. 90 tablet 3  . PARoxetine (PAXIL) 20 MG tablet Take 1 tablet (20 mg total) by mouth daily. 90 tablet 3  . simvastatin (ZOCOR) 20 MG tablet TAKE 1 TABLET (20 MG TOTAL) BY MOUTH DAILY. 90 tablet 3  . Vitamin D, Ergocalciferol, (DRISDOL) 50000 units CAPS capsule Take 1 capsule (50,000 Units total) by mouth every 7 (seven) days. 12 capsule 1   No current facility-administered medications for this visit.      Allergies:   Clarithromycin; Colesevelam hcl; and Propoxyphene   Social History:  The patient  reports that  has never smoked. she has never used smokeless tobacco. She reports that she does not drink alcohol or use drugs.   Family History:   family history includes Alcohol abuse in her brother; Alzheimer's disease in her father; Brain cancer in her sister; Colon cancer in her other; Esophageal cancer  in her mother; Ovarian cancer in her other; Parkinsonism in her brother.    Review of Systems: Review of Systems  Constitutional: Negative.   HENT:       Pounding in her ears  Respiratory: Negative.   Cardiovascular: Negative.   Gastrointestinal: Negative.   Musculoskeletal: Negative.   Neurological: Negative.   Psychiatric/Behavioral: Negative.   All other systems reviewed and are negative.    PHYSICAL EXAM: VS:  BP (!) 150/70 (BP Location: Right Arm, Patient Position: Sitting, Cuff Size: Normal)   Pulse (!) 52   Ht 5' 3.5" (1.613 m)   Wt 121 lb 8 oz (55.1 kg)    BMI 21.19 kg/m  , BMI Body mass index is 21.19 kg/m. GEN: Well nourished, well developed, in no acute distress  HEENT: normal  Neck: no JVD, carotid bruits, or masses Cardiac: RRR; no murmurs, rubs, or gallops,no edema  Respiratory:  clear to auscultation bilaterally, normal work of breathing GI: soft, nontender, nondistended, + BS MS: no deformity or atrophy  Skin: warm and dry, no rash Neuro:  Strength and sensation are intact Psych: euthymic mood, full affect    Recent Labs: 06/29/2017: ALT 8 10/15/2017: BUN 22; Creatinine, Ser 1.26; Hemoglobin 13.4; Platelets 243; Potassium 3.2; Sodium 138    Lipid Panel Lab Results  Component Value Date   CHOL 233 (H) 06/29/2017   HDL 74 06/29/2017   LDLCALC 123 (H) 01/22/2017   TRIG 137 06/29/2017      Wt Readings from Last 3 Encounters:  11/24/17 121 lb 8 oz (55.1 kg)  10/15/17 120 lb (54.4 kg)  10/15/17 120 lb (54.4 kg)       ASSESSMENT AND PLAN:  PAF (paroxysmal atrial fibrillation) ([place]) - Plan: EKG 12-Lead Long history of pounding in her ears, headaches History of stroke 2018 Atrial fibrillation documented on EKG recently January 2019 Started on metoprolol from the emergency room and palpitations in her ear seems to have stopped Concern of bradycardia and fatigue, will need to closely monitor with blood pressure cuff at home Recommend restart Eliquis 2.5 mg twice daily based on age and weight less than 60 kg  Mixed hyperlipidemia Recommend she continue her statin  Pounding noise in both ears Long history of headaches, pounding in her ears likely secondary to paroxysmal atrial fibrillation She reports this has been ongoing for months to years  Cerebrovascular accident (CVA) due to embolism of precerebral artery ([place]) Stroke 2018 prior workup with echocardiogram reviewed with her on today's visit Likely from paroxysmal atrial fibrillation  Disposition:   F/U  1 month  Long discussion concerning atrial  fibrillation, management  Total encounter time more than 60 minutes  Greater than 50% was spent in counseling and coordination of care with the patient     Orders Placed This Encounter  Procedures  . EKG 12-Lead     Signed, Esmond Plants, M.D., Ph.D. 11/24/2017  [place], [place]

## 2017-11-24 ENCOUNTER — Encounter: Payer: Self-pay | Admitting: Cardiovascular Disease

## 2017-11-24 ENCOUNTER — Ambulatory Visit: Payer: Medicare HMO | Admitting: Cardiovascular Disease

## 2017-11-24 VITALS — BP 150/70 | HR 52 | Ht 63.5 in | Wt 121.5 lb

## 2017-11-24 DIAGNOSIS — I480 Paroxysmal atrial fibrillation: Secondary | ICD-10-CM

## 2017-11-24 DIAGNOSIS — E782 Mixed hyperlipidemia: Secondary | ICD-10-CM

## 2017-11-24 DIAGNOSIS — I6310 Cerebral infarction due to embolism of unspecified precerebral artery: Secondary | ICD-10-CM

## 2017-11-24 DIAGNOSIS — H938X3 Other specified disorders of ear, bilateral: Secondary | ICD-10-CM

## 2017-11-24 DIAGNOSIS — I639 Cerebral infarction, unspecified: Secondary | ICD-10-CM | POA: Insufficient documentation

## 2017-11-24 MED ORDER — APIXABAN 2.5 MG PO TABS: 2.5000 mg | ORAL_TABLET | Freq: Two times a day (BID) | ORAL | 11 refills | Status: DC

## 2017-11-24 MED ORDER — METOPROLOL TARTRATE 25 MG PO TABS: 25.0000 mg | ORAL_TABLET | Freq: Two times a day (BID) | ORAL | 3 refills | Status: DC

## 2017-11-24 MED ORDER — METOPROLOL TARTRATE 25 MG PO TABS: 25.0000 mg | ORAL_TABLET | Freq: Two times a day (BID) | ORAL | 3 refills | Status: AC

## 2017-11-24 NOTE — Patient Instructions (Addendum)
Medication Instructions:  Your physician has recommended you make the following change in your medication:  1. STOP Aspirin 2. START Eliquis 2.5 mg twice a day for atrial fibrillation   Refill sent in for metoprolol  Medication Samples have been provided to the patient.  Drug name: Eliquis       Strength: 2.5 mg        Qty: 3 boxes  LOT: [id number]  Exp.Date: 7/20  Labwork:  No new labs needed  Testing/Procedures:  No further testing at this time   Follow-Up: It was a pleasure seeing you in the office today. Please call us if you have new issues that need to be addressed before your next appt.  [phone]  Your physician wants you to follow-up in: 1 month.   If you need a refill on your cardiac medications before your next appointment, please call your pharmacy.  For educational health videos Log in to : [email or web address] Or : [email or web address], password : [email or web address]

## 2017-12-22 ENCOUNTER — Encounter: Payer: Self-pay | Admitting: Physician Assistant

## 2017-12-22 ENCOUNTER — Ambulatory Visit (INDEPENDENT_AMBULATORY_CARE_PROVIDER_SITE_OTHER): Payer: Medicare HMO | Admitting: Physician Assistant

## 2017-12-22 VITALS — BP 130/80 | HR 68 | Temp 97.9°F | Resp 16 | Ht 64.0 in | Wt 121.6 lb

## 2017-12-22 DIAGNOSIS — I480 Paroxysmal atrial fibrillation: Secondary | ICD-10-CM | POA: Diagnosis not present

## 2017-12-22 DIAGNOSIS — E559 Vitamin D deficiency, unspecified: Secondary | ICD-10-CM

## 2017-12-22 DIAGNOSIS — E782 Mixed hyperlipidemia: Secondary | ICD-10-CM

## 2017-12-22 NOTE — Progress Notes (Signed)
[name] Female    DOB: 05/27/1922   84 y.o.   MRN: [id number] Visit Date: 12/22/2017  Today's Provider: Mar Daring, PA-C   Chief Complaint  Patient presents with  . Hyperlipidemia  . Atrial Fibrillation   Subjective:    HPI  Follow up for Afib  The patient was last seen for this on 10/15/17, patient was sent to ER. Patient saw Dr. Rockey Situ on 11/24/17 Changes made at last visit include patient advised to restart Eliquis and stop Aspirin until next fu with Dr. Rockey Situ.  She reports excellent compliance with treatment. She feels that condition is Improved. She is not having side effects.   ------------------------------------------------------------------------------------   Lipid/Cholesterol, Follow-up:   Last seen for this 6 months ago.  Management changes since that visit include check labs. . Last Lipid Panel:    Component Value Date/Time   CHOL 233 (H) 06/29/2017 0851   CHOL 225 (H) 01/22/2017 0820   TRIG 137 06/29/2017 0851   HDL 74 06/29/2017 0851   HDL 79 01/22/2017 0820   CHOLHDL 3.1 06/29/2017 0851   LDLCALC 134 (H) 06/29/2017 0851    Risk factors for vascular disease include hypercholesterolemia  She reports excellent compliance with treatment. She is not having side effects.  Current symptoms include none and have been stable. Weight trend: stable Prior visit with dietician: no Current diet: in general, a "healthy" diet   Current exercise: housecleaning  Wt Readings from Last 3 Encounters:  12/22/17 121 lb 9.6 oz (55.2 kg)  11/24/17 121 lb 8 oz (55.1 kg)  10/15/17 120 lb (54.4 kg)   -------------------------------------------------------------------  GERD, Follow up:  The patient was last seen for GERD 6 months ago. Changes made since that visit include no changes.  She reports excellent compliance with treatment. She is not having side effects. .  She IS experiencing no symptoms. She is NOT experiencing  abdominal bloating, belching or heartburn  ------------------------------------------------------------------------     Allergies  Allergen Reactions  . Clarithromycin   . Colesevelam Hcl     Burns stomach.  . Propoxyphene      Current Outpatient Medications:  .  apixaban (ELIQUIS) 2.5 MG TABS tablet, Take 1 tablet (2.5 mg total) by mouth 2 (two) times daily., Disp: 60 tablet, Rfl: 11 .  metoprolol tartrate (LOPRESSOR) 25 MG tablet, Take 1 tablet (25 mg total) by mouth 2 (two) times daily., Disp: 180 tablet, Rfl: 3 .  pantoprazole (PROTONIX) 40 MG tablet, TAKE 1 TABLET (40 MG TOTAL) BY MOUTH DAILY., Disp: 90 tablet, Rfl: 3 .  PARoxetine (PAXIL) 20 MG tablet, Take 1 tablet (20 mg total) by mouth daily., Disp: 90 tablet, Rfl: 3 .  simvastatin (ZOCOR) 20 MG tablet, TAKE 1 TABLET (20 MG TOTAL) BY MOUTH DAILY., Disp: 90 tablet, Rfl: 3 .  Vitamin D, Ergocalciferol, (DRISDOL) 50000 units CAPS capsule, Take 1 capsule (50,000 Units total) by mouth every 7 (seven) days., Disp: 12 capsule, Rfl: 1  Review of Systems  Constitutional: Negative.   Respiratory: Negative.   Cardiovascular: Negative.     Social History   Tobacco Use  . Smoking status: Never Smoker  . Smokeless tobacco: Never Used  Substance Use Topics  . Alcohol use: No   Objective:   BP 130/80 (BP Location: Left Arm, Patient Position: Sitting, Cuff Size: Normal)   Pulse 68   Temp 97.9 F (36.6 C) (Oral)   Resp 16   Ht 5\' 4"  (1.626 m)  Wt 121 lb 9.6 oz (55.2 kg)   SpO2 99%   BMI 20.87 kg/m  Vitals:   12/22/17 0805  BP: 130/80  Pulse: 68  Resp: 16  Temp: 97.9 F (36.6 C)  TempSrc: Oral  SpO2: 99%  Weight: 121 lb 9.6 oz (55.2 kg)  Height: 5\' 4"  (1.626 m)     Physical Exam  Constitutional: She appears well-developed and well-nourished. No distress.  Neck: Normal range of motion. Neck supple. No JVD present. No tracheal deviation present. No thyromegaly present.  Cardiovascular: Normal rate, regular  rhythm, normal heart sounds and intact distal pulses. Exam reveals no gallop and no friction rub.  No murmur heard. Pulmonary/Chest: Effort normal and breath sounds normal. No respiratory distress. She has no wheezes. She has no rales.  Musculoskeletal: She exhibits no edema.  Lymphadenopathy:    She has no cervical adenopathy.  Skin: She is not diaphoretic.  Vitals reviewed.      Assessment & Plan:     1. PAF (paroxysmal atrial fibrillation) (HCC) Sounds to be in sinus rhythm today. Continue Eliquis 5mg  BID. Will check labs as below and f/u pending results. - CBC w/Diff/Platelet - Basic Metabolic Panel (BMET) - Lipid Profile  2. Mixed hyperlipidemia Stable. Will check labs as below and f/u pending results. - CBC w/Diff/Platelet - Basic Metabolic Panel (BMET) - Lipid Profile  3. Avitaminosis D H/O this. Will check labs as below and f/u pending results. - CBC w/Diff/Platelet - Vitamin D (25 hydroxy)       Mar Daring, PA-C  [facility]

## 2017-12-23 LAB — BASIC METABOLIC PANEL
BUN/Creatinine Ratio: 15 (ref 12–28)
BUN: 18 mg/dL (ref 10–36)
CO2: 24 mmol/L (ref 20–29)
CREATININE: 1.21 mg/dL — AB (ref 0.57–1.00)
Calcium: 9.5 mg/dL (ref 8.7–10.3)
Chloride: 102 mmol/L (ref 96–106)
GFR calc Af Amer: 45 mL/min/{1.73_m2} — ABNORMAL LOW (ref >59)
GFR calc non Af Amer: 39 mL/min/{1.73_m2} — ABNORMAL LOW (ref >59)
Glucose: 90 mg/dL (ref 65–99)
Potassium: 4.4 mmol/L (ref 3.5–5.2)
SODIUM: 141 mmol/L (ref 134–144)

## 2017-12-23 LAB — CBC WITH DIFFERENTIAL/PLATELET
Basophils Absolute: 0 10*3/uL (ref 0.0–0.2)
Basos: 0 %
EOS (ABSOLUTE): 0.1 10*3/uL (ref 0.0–0.4)
EOS: 2 %
HEMATOCRIT: 37.4 % (ref 34.0–46.6)
HEMOGLOBIN: 11.6 g/dL (ref 11.1–15.9)
IMMATURE GRANS (ABS): 0 10*3/uL (ref 0.0–0.1)
Immature Granulocytes: 0 %
LYMPHS ABS: 1.8 10*3/uL (ref 0.7–3.1)
LYMPHS: 24 %
MCH: 28.8 pg (ref 26.6–33.0)
MCHC: 31 g/dL — AB (ref 31.5–35.7)
MCV: 93 fL (ref 79–97)
MONOCYTES: 10 %
Monocytes Absolute: 0.7 10*3/uL (ref 0.1–0.9)
Neutrophils Absolute: 4.8 10*3/uL (ref 1.4–7.0)
Neutrophils: 64 %
Platelets: 262 10*3/uL (ref 150–379)
RBC: 4.03 x10E6/uL (ref 3.77–5.28)
RDW: 14.4 % (ref 12.3–15.4)
WBC: 7.4 10*3/uL (ref 3.4–10.8)

## 2017-12-23 LAB — VITAMIN D 25 HYDROXY (VIT D DEFICIENCY, FRACTURES): Vit D, 25-Hydroxy: 26.4 ng/mL — ABNORMAL LOW (ref 30.0–100.0)

## 2017-12-23 LAB — LIPID PANEL
CHOLESTEROL TOTAL: 216 mg/dL — AB (ref 100–199)
Chol/HDL Ratio: 2.9 ratio (ref 0.0–4.4)
HDL: 75 mg/dL (ref >39)
LDL CALC: 119 mg/dL — AB (ref 0–99)
TRIGLYCERIDES: 110 mg/dL (ref 0–149)
VLDL CHOLESTEROL CAL: 22 mg/dL (ref 5–40)

## 2017-12-26 NOTE — Progress Notes (Signed)
Cardiology Office Note  Date:  12/29/2017   ID:  Amanda, Robinson 09-12-1922, MRN [id number]  PCP:  Mar Daring, PA-C   Chief Complaint  Patient presents with  . OTHER    1 month f/u no complaints today. Meds reviewed verbally with pt.    HPI:  Amanda Robinson is a 84 y.o. female  H/a. Wakes at night with thrubbing Ears out of wack Occipital CVA workup 2018  went to her primary care physician's office 10/15/2017 because of urinary symptoms.   issues with the frequency.    found to be tachycardic with an irregular rhythm. Rate 160 bpm Felt to be in atrial fibrillation and sent to the ER She presents for follow-up of her paroxysmal atrial fibrillation  In follow-up today she has been monitoring blood pressure heart rate at home Numbers reviewed with her in detail,  heart rate typically 50 to 80 Denies any tachycardia or palpitations concerning for atrial fibrillation  Did not feel good on Eliquis for the first 2 weeks now reports that she feels fine with no symptoms Occasional bruising  Lab work reviewed with her in detail BMP normal CR 1.2 Total chol 216 Vit D 26, takes over-the-counter supplement occasionally  Previous CT neck: 03/2017 results reviewed with her in detail Aortic athero cartoid athero   Does not want more statin  No chest pain or shortness of breath Denies tachycardia  Previous hospital records reviewed HTN in the ER tnt neg CBC wnl BMP cr 1.26, k 3.2 EKG showed atrial fibrillation Follow up ekg with NSR Started on metoprolol at discharge from the hospital  Previous roaring and palpation in her head, this is not an issue on today's visit Dating back months to years Previous stroke with workup 2018  Echocardiogram July 2018 Normal LV function ejection fraction 55-60% normal left atrial size Negative bubble study RV normal size and function  EKG personally reviewed by myself on todays visit Shows sinus bradycardia rate 53 bpm  nonspecific ST abnormality anterolateral leads Unchanged from previous EKG March 2019  PMH:   has a past medical history of Afib ([place]), CAD (coronary artery disease), Chronic kidney disease, and Skin cancer.  PSH:    Past Surgical History:  Procedure Laterality Date  . ABDOMINAL HYSTERECTOMY  1963  . BREAST SURGERY Bilateral 1985   Biopsy  . CHOLECYSTECTOMY  03/1998  . 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 PARTIAL COLECTOMY  1963    Current Outpatient Medications  Medication Sig Dispense Refill  . apixaban (ELIQUIS) 2.5 MG TABS tablet Take 1 tablet (2.5 mg total) by mouth 2 (two) times daily. 60 tablet 11  . metoprolol tartrate (LOPRESSOR) 25 MG tablet Take 1 tablet (25 mg total) by mouth 2 (two) times daily. 180 tablet 3  . pantoprazole (PROTONIX) 40 MG tablet TAKE 1 TABLET (40 MG TOTAL) BY MOUTH DAILY. 90 tablet 3  . PARoxetine (PAXIL) 20 MG tablet Take 1 tablet (20 mg total) by mouth daily. 90 tablet 3  . simvastatin (ZOCOR) 20 MG tablet TAKE 1 TABLET (20 MG TOTAL) BY MOUTH DAILY. 90 tablet 3  . Vitamin D, Ergocalciferol, (DRISDOL) 50000 units CAPS capsule Take 1 capsule (50,000 Units total) by mouth every 7 (seven) days. 12 capsule 1   No current facility-administered medications for this visit.      Allergies:   Clarithromycin; Colesevelam hcl; and Propoxyphene   Social History:  The patient  reports that she has never  smoked. She has never used smokeless tobacco. She reports that she does not drink alcohol or use drugs.   Family History:   family history includes Alcohol abuse in her brother; Alzheimer's disease in her father; Brain cancer in her sister; Colon cancer in her other; Esophageal cancer in her mother; Ovarian cancer in her other; Parkinsonism in her brother.    Review of Systems: Review of Systems  Constitutional: Negative.   HENT:       Pounding in her ears  Respiratory: Negative.   Cardiovascular: Negative.   Gastrointestinal: Negative.    Musculoskeletal: Negative.   Neurological: Negative.   Psychiatric/Behavioral: Negative.   All other systems reviewed and are negative.   PHYSICAL EXAM: VS:  BP (!) 158/80 (BP Location: Left Arm, Patient Position: Sitting, Cuff Size: Normal)   Pulse (!) 53   Ht 5\' 3"  (1.6 m)   Wt 123 lb 8 oz (56 kg)   BMI 21.88 kg/m  , BMI Body mass index is 21.88 kg/m. GEN: Well nourished, well developed, in no acute distress  HEENT: normal  Neck: no JVD, carotid bruits, or masses Cardiac: RRR; no murmurs, rubs, or gallops,no edema  Respiratory:  clear to auscultation bilaterally, normal work of breathing GI: soft, nontender, nondistended, + BS MS: no deformity or atrophy  Skin: warm and dry, no rash Neuro:  Strength and sensation are intact Psych: euthymic mood, full affect   Recent Labs: 06/29/2017: ALT 8 12/22/2017: BUN 18; Creatinine, Ser 1.21; Hemoglobin 11.6; Platelets 262; Potassium 4.4; Sodium 141    Lipid Panel Lab Results  Component Value Date   CHOL 216 (H) 12/22/2017   HDL 75 12/22/2017   LDLCALC 119 (H) 12/22/2017   TRIG 110 12/22/2017      Wt Readings from Last 3 Encounters:  12/29/17 123 lb 8 oz (56 kg)  12/22/17 121 lb 9.6 oz (55.2 kg)  11/24/17 121 lb 8 oz (55.1 kg)       ASSESSMENT AND PLAN:  PAF (paroxysmal atrial fibrillation) (HCC) - Plan: EKG 12-Lead Initially had trouble but now tolerating Eliquis 2.5 mg twice daily  Dosing based on age and weight less than 60 kg Bradycardic but asymptomatic We will continue current medications  Mixed hyperlipidemia She is on low-dose statin She does have significant carotid and aortic atherosclerosis discussed with her in detail She does not want additional medications and does not want to change dose at this time prefers to do it through diet changes  Pounding noise in both ears Long history of headaches, pounding in her ears  Possibly secondary to paroxysmal atrial fibrillation This is not an issue at this  time, will continue metoprolol for rhythm control  Cerebrovascular accident (CVA) due to embolism of precerebral artery ([place]) Stroke 2018  Likely from paroxysmal atrial fibrillation We will continue Eliquis 2.5 twice daily  Disposition:   F/U  12 month  Long discussion concerning atrial fibrillation  Management  and cholesterol management  Total encounter time more than 25 minutes  Greater than 50% was spent in counseling and coordination of care with the patient     Orders Placed This Encounter  Procedures  . EKG 12-Lead     Signed, Esmond Plants, M.D., Ph.D. 12/29/2017  [place], [place]

## 2017-12-28 ENCOUNTER — Encounter: Payer: Self-pay | Admitting: Physician Assistant

## 2017-12-29 ENCOUNTER — Encounter: Payer: Self-pay | Admitting: Cardiovascular Disease

## 2017-12-29 ENCOUNTER — Ambulatory Visit: Payer: Medicare HMO | Admitting: Cardiovascular Disease

## 2017-12-29 VITALS — BP 158/80 | HR 53 | Ht 63.0 in | Wt 123.5 lb

## 2017-12-29 DIAGNOSIS — I2510 Atherosclerotic heart disease of native coronary artery without angina pectoris: Secondary | ICD-10-CM | POA: Diagnosis not present

## 2017-12-29 DIAGNOSIS — I480 Paroxysmal atrial fibrillation: Secondary | ICD-10-CM | POA: Diagnosis not present

## 2017-12-29 DIAGNOSIS — E782 Mixed hyperlipidemia: Secondary | ICD-10-CM | POA: Diagnosis not present

## 2017-12-29 DIAGNOSIS — I700 Atherosclerosis of aorta: Secondary | ICD-10-CM | POA: Diagnosis not present

## 2017-12-29 DIAGNOSIS — I6529 Occlusion and stenosis of unspecified carotid artery: Secondary | ICD-10-CM

## 2017-12-29 DIAGNOSIS — I6310 Cerebral infarction due to embolism of unspecified precerebral artery: Secondary | ICD-10-CM

## 2017-12-29 NOTE — Patient Instructions (Signed)

## 2018-01-14 ENCOUNTER — Telehealth: Payer: Self-pay

## 2018-01-14 ENCOUNTER — Encounter: Payer: Self-pay | Admitting: Family Medicine

## 2018-01-14 ENCOUNTER — Ambulatory Visit (INDEPENDENT_AMBULATORY_CARE_PROVIDER_SITE_OTHER): Payer: Medicare HMO | Admitting: Family Medicine

## 2018-01-14 VITALS — BP 140/78 | HR 66 | Temp 97.7°F | Resp 16 | Wt 121.0 lb

## 2018-01-14 DIAGNOSIS — R519 Headache, unspecified: Secondary | ICD-10-CM

## 2018-01-14 DIAGNOSIS — R443 Hallucinations, unspecified: Secondary | ICD-10-CM | POA: Diagnosis not present

## 2018-01-14 DIAGNOSIS — R51 Headache: Secondary | ICD-10-CM | POA: Diagnosis not present

## 2018-01-14 DIAGNOSIS — I679 Cerebrovascular disease, unspecified: Secondary | ICD-10-CM | POA: Diagnosis not present

## 2018-01-14 DIAGNOSIS — N3001 Acute cystitis with hematuria: Secondary | ICD-10-CM

## 2018-01-14 LAB — POCT URINALYSIS DIPSTICK
Glucose, UA: NEGATIVE
Ketones, UA: NEGATIVE
NITRITE UA: NEGATIVE
PH UA: 6 (ref 5.0–8.0)
Spec Grav, UA: >=1.03 — AB (ref 1.010–1.025)
UROBILINOGEN UA: 0.2 U/dL

## 2018-01-14 MED ORDER — ROSUVASTATIN CALCIUM 20 MG PO TABS: 20.0000 mg | ORAL_TABLET | Freq: Every day | ORAL | 5 refills | Status: DC

## 2018-01-14 MED ORDER — AMOXICILLIN 500 MG PO CAPS
500.0000 mg | ORAL_CAPSULE | Freq: Three times a day (TID) | ORAL | 0 refills | Status: DC
Start: 2018-01-14 — End: 2018-02-15

## 2018-01-14 NOTE — Telephone Encounter (Signed)
Noted. Patient seen in office this morning at 8:40 am.

## 2018-01-14 NOTE — Telephone Encounter (Signed)
Patient's son called requesting appointment for Amanda Robinson. He reports Amanda Robinson was having hallucinating last night, patient denies any other symptoms. Patient denies painful urination or burning. Patient is alert this morning and refused to go to the ER. Patient reports she is drinking well. Patient's son did check her BP this morning and reports 170/60. Patient denies any headaches, nausea or vomiting this morning.

## 2018-01-14 NOTE — Progress Notes (Signed)
[name] Female    DOB: [birth year]   84 y.o.   MRN: [id number] Visit Date: 01/14/2018  Today's Provider: Lelon Huh, MD   Chief Complaint  Patient presents with  . Altered Mental Status  . Headache   Subjective:    HPI Patient is brought in by her husband and son today who states she has been having episodes of visual hallucinations last night and this morning. Episodes associated with confusion, not knowing where she was or when it was.  Last night patient got up at 11:00 pm and thought it was morning and was up all night able to sleep. They her report BP was elevated at 170/60 and 168/60 this morning. Episodes has now resolved and is she now back to baseline. Patient does remember episodes and vividly remember seeing people who were not there.   Patient has also been having more frequent headaches for the last few weeks. Patient presents thorough and long history of headaches that she used to get a few times a year, but the last several months have been occurring about once a month. The last was two days, but had resolved by yesterday.   Review of patients history shows she did have MRI in July of 2018 revealing subacute right occipital lobe PCA infarcts and chronic small vessel ischemic disease. CTA of neck revealed had atherosclerotic changes of carotid bifurcations without significant stenosis. She was havng some visual disturbances at that time. She has had a few brief episodes of hallucinations in the past, but not as vivid or prolonged as episode yesterday.   She is also noted to have new onset atrial fibrillation in July at which time she was started on Eliquis and metoprolol which she has been taking consistently.     Allergies  Allergen Reactions  . Clarithromycin   . Colesevelam Hcl     Burns stomach.  . Propoxyphene      Current Outpatient Medications:  .  apixaban (ELIQUIS) 2.5 MG TABS tablet, Take 1 tablet (2.5 mg total) by mouth 2 (two)  times daily., Disp: 60 tablet, Rfl: 11 .  metoprolol tartrate (LOPRESSOR) 25 MG tablet, Take 1 tablet (25 mg total) by mouth 2 (two) times daily., Disp: 180 tablet, Rfl: 3 .  pantoprazole (PROTONIX) 40 MG tablet, TAKE 1 TABLET (40 MG TOTAL) BY MOUTH DAILY., Disp: 90 tablet, Rfl: 3 .  PARoxetine (PAXIL) 20 MG tablet, Take 1 tablet (20 mg total) by mouth daily., Disp: 90 tablet, Rfl: 3 .  simvastatin (ZOCOR) 20 MG tablet, TAKE 1 TABLET (20 MG TOTAL) BY MOUTH DAILY., Disp: 90 tablet, Rfl: 3 .  Vitamin D, Ergocalciferol, (DRISDOL) 50000 units CAPS capsule, Take 1 capsule (50,000 Units total) by mouth every 7 (seven) days., Disp: 12 capsule, Rfl: 1  Review of Systems  Constitutional: Negative for appetite change, chills, fatigue and fever.  Respiratory: Negative for chest tightness and shortness of breath.   Cardiovascular: Negative for chest pain and palpitations.  Gastrointestinal: Negative for abdominal pain, nausea and vomiting.  Neurological: Negative for dizziness and weakness.    Social History   Tobacco Use  . Smoking status: Never Smoker  . Smokeless tobacco: Never Used  Substance Use Topics  . Alcohol use: No   Objective:   BP (!) 158/88 (BP Location: Right Arm, Patient Position: Sitting, Cuff Size: Normal)   Pulse 66   Temp 97.7 F (36.5 C) (Oral)   Resp 16   Wt 121 lb (  54.9 kg)   SpO2 97%   BMI 21.43 kg/m  Vitals:   01/14/18 0857 01/14/18 0932  BP: (!) 158/88 140/78  Pulse: 66   Resp: 16   Temp: 97.7 F (36.5 C)   TempSrc: Oral   SpO2: 97%   Weight: 121 lb (54.9 kg)      Physical Exam   General Appearance:    Alert, cooperative, no distress  Eyes:    PERRL, conjunctiva/corneas clear, EOM's intact       Lungs:     Clear to auscultation bilaterally, respirations unlabored  Heart:    Regular rate and rhythm  Neurologic:   Awake, alert, oriented x 3. No apparent focal neurological           defect.       Results for orders placed or performed in visit on  01/14/18  POCT urinalysis dipstick  Result Value Ref Range   Color, UA Dark Yellow    Clarity, UA Slightly Cloudly    Glucose, UA Neg    Bilirubin, UA Trace    Ketones, UA Neg    Spec Grav, UA >=1.030 (A) 1.010 - 1.025   Blood, UA Moderate    pH, UA 6.0 5.0 - 8.0   Protein, UA 30++    Urobilinogen, UA 0.2 0.2 or 1.0 E.U./dL   Nitrite, UA Neg    Leukocytes, UA Trace (A) Negative   Appearance Cloudy    Odor Strong         Assessment & Plan:     1. Hallucinations Now resolved and back to her baseline. Considering association with transient BP elevation she may be having TIAs. May also be related to UTI, although I wouldn't expect her to have returned to her baseline without treatment. She did have normal met C just a few weeks ago.  Will change to high intensity statin as below. Treat for UTI as below. Have neuro evaluate. Go to ER if symptoms return - Ambulatory referral to Neurology  2. Acute cystitis with hematuria  - POCT urinalysis dipstick - CULTURE, URINE COMPREHENSIVE - amoxicillin (AMOXIL) 500 MG capsule; Take 1 capsule (500 mg total) by mouth 3 (three) times daily.  Dispense: 30 capsule; Refill: 0  3. Cerebrovascular disease Change to high intensity statin. Is already on NOAC. Her baseline BP is usually in the 120s, so will not change meds at this time.  - rosuvastatin (CRESTOR) 20 MG tablet; Take 1 tablet (20 mg total) by mouth daily.  Dispense: 30 tablet; Refill: 5 - Ambulatory referral to Neurology  4. Nonintractable episodic headache, unspecified headache type Have been much more frequent the last few months.  - Ambulatory referral to Neurology       Lelon Huh, MD  [facility]

## 2018-01-14 NOTE — Patient Instructions (Signed)
   STOP simvastatin and start taking ROSUVASTATIN in its place

## 2018-01-16 LAB — CULTURE, URINE COMPREHENSIVE

## 2018-01-25 ENCOUNTER — Other Ambulatory Visit: Payer: Self-pay | Admitting: Nurse Practitioner

## 2018-01-25 DIAGNOSIS — R410 Disorientation, unspecified: Secondary | ICD-10-CM | POA: Diagnosis not present

## 2018-01-25 DIAGNOSIS — R51 Headache: Secondary | ICD-10-CM | POA: Diagnosis not present

## 2018-01-25 DIAGNOSIS — I480 Paroxysmal atrial fibrillation: Secondary | ICD-10-CM | POA: Diagnosis not present

## 2018-01-25 DIAGNOSIS — R413 Other amnesia: Secondary | ICD-10-CM | POA: Diagnosis not present

## 2018-01-25 DIAGNOSIS — R441 Visual hallucinations: Secondary | ICD-10-CM | POA: Diagnosis not present

## 2018-02-04 ENCOUNTER — Ambulatory Visit
Admission: RE | Admit: 2018-02-04 | Discharge: 2018-02-04 | Disposition: A | Discharge status: Home / Self Care | Payer: Medicare HMO | Source: Ambulatory Visit | Attending: Nurse Practitioner | Admitting: Nurse Practitioner

## 2018-02-04 DIAGNOSIS — R51 Headache: Secondary | ICD-10-CM | POA: Diagnosis not present

## 2018-02-04 DIAGNOSIS — R410 Disorientation, unspecified: Secondary | ICD-10-CM | POA: Insufficient documentation

## 2018-02-04 DIAGNOSIS — R413 Other amnesia: Secondary | ICD-10-CM | POA: Diagnosis not present

## 2018-02-04 DIAGNOSIS — H539 Unspecified visual disturbance: Secondary | ICD-10-CM | POA: Diagnosis not present

## 2018-02-13 ENCOUNTER — Encounter: Payer: Self-pay | Admitting: *Deleted

## 2018-02-13 ENCOUNTER — Other Ambulatory Visit: Payer: Self-pay

## 2018-02-13 ENCOUNTER — Inpatient Hospital Stay
Admission: EM | Admit: 2018-02-13 | Discharge: 2018-02-15 | DRG: 101 | Disposition: A | Discharge status: Home / Self Care | Payer: Medicare HMO | Attending: Internal Medicine | Admitting: Internal Medicine

## 2018-02-13 ENCOUNTER — Emergency Department: Payer: Medicare HMO

## 2018-02-13 DIAGNOSIS — R569 Unspecified convulsions: Secondary | ICD-10-CM

## 2018-02-13 DIAGNOSIS — Z7901 Long term (current) use of anticoagulants: Secondary | ICD-10-CM

## 2018-02-13 DIAGNOSIS — R001 Bradycardia, unspecified: Secondary | ICD-10-CM | POA: Diagnosis not present

## 2018-02-13 DIAGNOSIS — R251 Tremor, unspecified: Secondary | ICD-10-CM | POA: Diagnosis not present

## 2018-02-13 DIAGNOSIS — I482 Chronic atrial fibrillation: Secondary | ICD-10-CM | POA: Diagnosis present

## 2018-02-13 DIAGNOSIS — G40909 Epilepsy, unspecified, not intractable, without status epilepticus: Secondary | ICD-10-CM | POA: Diagnosis not present

## 2018-02-13 DIAGNOSIS — Z79899 Other long term (current) drug therapy: Secondary | ICD-10-CM | POA: Diagnosis not present

## 2018-02-13 DIAGNOSIS — Z881 Allergy status to other antibiotic agents status: Secondary | ICD-10-CM

## 2018-02-13 DIAGNOSIS — G43109 Migraine with aura, not intractable, without status migrainosus: Secondary | ICD-10-CM | POA: Diagnosis present

## 2018-02-13 DIAGNOSIS — I4891 Unspecified atrial fibrillation: Secondary | ICD-10-CM | POA: Diagnosis not present

## 2018-02-13 DIAGNOSIS — Z888 Allergy status to other drugs, medicaments and biological substances status: Secondary | ICD-10-CM | POA: Diagnosis not present

## 2018-02-13 DIAGNOSIS — G43909 Migraine, unspecified, not intractable, without status migrainosus: Secondary | ICD-10-CM | POA: Diagnosis not present

## 2018-02-13 DIAGNOSIS — I2510 Atherosclerotic heart disease of native coronary artery without angina pectoris: Secondary | ICD-10-CM | POA: Diagnosis present

## 2018-02-13 DIAGNOSIS — R4182 Altered mental status, unspecified: Secondary | ICD-10-CM | POA: Diagnosis not present

## 2018-02-13 DIAGNOSIS — N189 Chronic kidney disease, unspecified: Secondary | ICD-10-CM | POA: Diagnosis not present

## 2018-02-13 DIAGNOSIS — Z85828 Personal history of other malignant neoplasm of skin: Secondary | ICD-10-CM

## 2018-02-13 DIAGNOSIS — R51 Headache: Secondary | ICD-10-CM | POA: Diagnosis not present

## 2018-02-13 DIAGNOSIS — R0902 Hypoxemia: Secondary | ICD-10-CM | POA: Diagnosis not present

## 2018-02-13 DIAGNOSIS — R410 Disorientation, unspecified: Secondary | ICD-10-CM | POA: Diagnosis not present

## 2018-02-13 LAB — URINALYSIS, COMPLETE (UACMP) WITH MICROSCOPIC
BILIRUBIN URINE: NEGATIVE
Bacteria, UA: NONE SEEN
Glucose, UA: NEGATIVE mg/dL
HGB URINE DIPSTICK: NEGATIVE
KETONES UR: NEGATIVE mg/dL
LEUKOCYTES UA: NEGATIVE
NITRITE: NEGATIVE
PH: 8 (ref 5.0–8.0)
Protein, ur: NEGATIVE mg/dL
SPECIFIC GRAVITY, URINE: 1.011 (ref 1.005–1.030)
Squamous Epithelial / LPF: NONE SEEN (ref 0–5)

## 2018-02-13 LAB — BASIC METABOLIC PANEL
Anion gap: 13 (ref 5–15)
BUN: 16 mg/dL (ref 6–20)
CHLORIDE: 106 mmol/L (ref 101–111)
CO2: 22 mmol/L (ref 22–32)
Calcium: 9.3 mg/dL (ref 8.9–10.3)
Creatinine, Ser: 1.2 mg/dL — ABNORMAL HIGH (ref 0.44–1.00)
GFR calc Af Amer: 44 mL/min — ABNORMAL LOW (ref >60)
GFR, EST NON AFRICAN AMERICAN: 38 mL/min — AB (ref >60)
Glucose, Bld: 144 mg/dL — ABNORMAL HIGH (ref 65–99)
POTASSIUM: 3.8 mmol/L (ref 3.5–5.1)
SODIUM: 141 mmol/L (ref 135–145)

## 2018-02-13 LAB — CBC
HEMATOCRIT: 35.5 % (ref 35.0–47.0)
Hemoglobin: 11.9 g/dL — ABNORMAL LOW (ref 12.0–16.0)
MCH: 30.1 pg (ref 26.0–34.0)
MCHC: 33.4 g/dL (ref 32.0–36.0)
MCV: 90.1 fL (ref 80.0–100.0)
PLATELETS: 208 10*3/uL (ref 150–440)
RBC: 3.94 MIL/uL (ref 3.80–5.20)
RDW: 13.8 % (ref 11.5–14.5)
WBC: 6.4 10*3/uL (ref 3.6–11.0)

## 2018-02-13 LAB — TROPONIN I: Troponin I: <0.03 ng/mL (ref <0.03)

## 2018-02-13 MED ORDER — LEVETIRACETAM IN NACL 500 MG/100ML IV SOLN
500.0000 mg | Freq: Two times a day (BID) | INTRAVENOUS | Status: DC
  Administered 2018-02-13: 500 mg via INTRAVENOUS
  Filled 2018-02-13 (×3): qty 100

## 2018-02-13 MED ORDER — ROSUVASTATIN CALCIUM 10 MG PO TABS
20.0000 mg | ORAL_TABLET | Freq: Every day | ORAL | Status: DC
  Administered 2018-02-13 – 2018-02-15 (×3): 20 mg via ORAL
  Filled 2018-02-13 (×3): qty 2

## 2018-02-13 MED ORDER — ONDANSETRON HCL 4 MG PO TABS: 4.0000 mg | ORAL_TABLET | Freq: Four times a day (QID) | ORAL | Status: DC | PRN

## 2018-02-13 MED ORDER — PAROXETINE HCL 20 MG PO TABS
20.0000 mg | ORAL_TABLET | Freq: Every day | ORAL | Status: DC
Start: 2018-02-13 — End: 2018-02-15
  Administered 2018-02-13 – 2018-02-15 (×3): 20 mg via ORAL
  Filled 2018-02-13 (×3): qty 1

## 2018-02-13 MED ORDER — SODIUM CHLORIDE 0.9 % IV SOLN
500.0000 mg | Freq: Two times a day (BID) | INTRAVENOUS | Status: DC
  Administered 2018-02-13 – 2018-02-15 (×4): 500 mg via INTRAVENOUS
  Filled 2018-02-13 (×6): qty 5

## 2018-02-13 MED ORDER — SENNOSIDES-DOCUSATE SODIUM 8.6-50 MG PO TABS: 1.0000 | ORAL_TABLET | Freq: Every evening | ORAL | Status: DC | PRN

## 2018-02-13 MED ORDER — VITAMIN D (ERGOCALCIFEROL) 1.25 MG (50000 UNIT) PO CAPS
50000.0000 [IU] | ORAL_CAPSULE | ORAL | Status: DC
  Administered 2018-02-14: 50000 [IU] via ORAL
  Filled 2018-02-13: qty 1

## 2018-02-13 MED ORDER — APIXABAN 2.5 MG PO TABS
2.5000 mg | ORAL_TABLET | Freq: Two times a day (BID) | ORAL | Status: DC
  Administered 2018-02-13 – 2018-02-15 (×4): 2.5 mg via ORAL
  Filled 2018-02-13 (×4): qty 1

## 2018-02-13 MED ORDER — PANTOPRAZOLE SODIUM 40 MG PO TBEC
40.0000 mg | DELAYED_RELEASE_TABLET | Freq: Every day | ORAL | Status: DC
  Administered 2018-02-13 – 2018-02-15 (×3): 40 mg via ORAL
  Filled 2018-02-13 (×3): qty 1

## 2018-02-13 MED ORDER — SODIUM CHLORIDE 0.9 % IV SOLN
INTRAVENOUS | Status: DC
  Administered 2018-02-13 – 2018-02-15 (×4): via INTRAVENOUS

## 2018-02-13 MED ORDER — ONDANSETRON HCL 4 MG/2ML IJ SOLN: 4.0000 mg | Freq: Four times a day (QID) | INTRAMUSCULAR | Status: DC | PRN

## 2018-02-13 MED ORDER — OXYCODONE-ACETAMINOPHEN 5-325 MG PO TABS
1.0000 | ORAL_TABLET | Freq: Four times a day (QID) | ORAL | Status: DC | PRN
  Administered 2018-02-13: 1 via ORAL
  Filled 2018-02-13: qty 1

## 2018-02-13 MED ORDER — ACETAMINOPHEN 325 MG PO TABS: 650.0000 mg | ORAL_TABLET | Freq: Four times a day (QID) | ORAL | Status: DC | PRN

## 2018-02-13 MED ORDER — BUTALBITAL-APAP-CAFFEINE 50-325-40 MG PO TABS
1.0000 | ORAL_TABLET | ORAL | Status: DC | PRN
  Administered 2018-02-13: 1 via ORAL
  Filled 2018-02-13: qty 1

## 2018-02-13 MED ORDER — ACETAMINOPHEN 325 MG PO TABS
650.0000 mg | ORAL_TABLET | Freq: Once | ORAL | Status: AC
  Administered 2018-02-13: 650 mg via ORAL
  Filled 2018-02-13: qty 2

## 2018-02-13 MED ORDER — ACETAMINOPHEN 650 MG RE SUPP: 650.0000 mg | Freq: Four times a day (QID) | RECTAL | Status: DC | PRN

## 2018-02-13 MED ORDER — METOPROLOL TARTRATE 25 MG PO TABS
25.0000 mg | ORAL_TABLET | Freq: Two times a day (BID) | ORAL | Status: DC
  Administered 2018-02-13 – 2018-02-15 (×4): 25 mg via ORAL
  Filled 2018-02-13 (×5): qty 1

## 2018-02-13 NOTE — ED Notes (Signed)
Patient transported to CT 

## 2018-02-13 NOTE — Progress Notes (Signed)
Advanced care plan. Purpose of the Encounter: CODE STATUS Parties in Attendance: Patient and family Patient's Decision Capacity: Good Subjective/Patient's story: Presented to the emergency room with seizure and headache Objective/Medical story Patient has had seizures On and off headaches Cannot tolerate bright light Goals of care determination:  Advanced directives and goals of care discussed with the patient and family For now now patient and family wants cardiac resuscitation, intubation and ventilator if the need arises Goals not met CODE STATUS: Full code Time spent discussing advanced care planning: 16 minutes

## 2018-02-13 NOTE — ED Notes (Signed)
This RN called and updated report for patient (keppra and fioricet administration) since I called report. Update given to [facility], RN.

## 2018-02-13 NOTE — ED Provider Notes (Signed)
[facility] Emergency Department Provider Note ____________________________________________   First MD Initiated Contact with Patient 02/13/18 [phone]     (approximate)  I have reviewed the triage vital signs and the nursing notes.   HISTORY  Chief Complaint Altered Mental Status  Level 5 caveat: History of present illness limited due to altered mental status  HPI Amanda Robinson is a 84 y.o. female with PMH as noted below who presents with apparent seizure-like episode.  Per the husband, he awoke to find the patient shaking in bed.  This lasted several minutes, and during it he was not able to wake her up.  She then continued to be somewhat confused although started awake when EMS arrived.  She did bite her tongue but did not have incontinence.  They state that she seems a little "off" right now but is closer to her baseline.  Per the husband and son the patient has migraine headaches which sometimes last a day, and which are also associated with some confusion.  Patient has been worked up by neurology and had an MRI last week.  She is scheduled for another appointment next month.  Per the husband, she may have had a similar episode once in the past.  Past Medical History:  Diagnosis Date  . Afib ([place])   . CAD (coronary artery disease)   . Chronic kidney disease   . Skin cancer     Patient Active Problem List   Diagnosis Date Noted  . Pounding noise in both ears 11/24/2017  . CVA (cerebral vascular accident) ([place]) 11/24/2017  . PAF (paroxysmal atrial fibrillation) ([place]) 11/22/2017  . Osteopenia 06/04/2015  . Atherosclerosis of coronary artery 04/17/2015  . Colon polyp 04/17/2015  . Insomnia 04/17/2015  . Acid reflux 04/17/2015  . Blood in the urine 04/17/2015  . HLD (hyperlipidemia) 04/17/2015  . 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 Avitaminosis D 04/17/2015  . Anxiety 04/05/2015  . Basal cell carcinoma of nose 02/16/2006    Past Surgical History:    Procedure Laterality Date  . ABDOMINAL HYSTERECTOMY  1963  . BREAST SURGERY Bilateral 1985   Biopsy  . CHOLECYSTECTOMY  03/1998  . 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 PARTIAL COLECTOMY  1963    Prior to Admission medications   Medication Sig Start Date End Date Taking? Authorizing Provider  apixaban (ELIQUIS) 2.5 MG TABS tablet Take 1 tablet (2.5 mg total) by mouth 2 (two) times daily. 11/24/17  Yes Minna Merritts, MD  metoprolol tartrate (LOPRESSOR) 25 MG tablet Take 1 tablet (25 mg total) by mouth 2 (two) times daily. 11/24/17  Yes Gollan, Kathlene November, MD  pantoprazole (PROTONIX) 40 MG tablet TAKE 1 TABLET (40 MG TOTAL) BY MOUTH DAILY. 09/14/17  Yes Fenton Malling M, PA-C  PARoxetine (PAXIL) 20 MG tablet Take 1 tablet (20 mg total) by mouth daily. 10/26/17  Yes Mar Daring, PA-C  rosuvastatin (CRESTOR) 20 MG tablet Take 1 tablet (20 mg total) by mouth daily. 01/14/18  Yes Birdie Sons, MD  Vitamin D, Ergocalciferol, (DRISDOL) 50000 units CAPS capsule Take 1 capsule (50,000 Units total) by mouth every 7 (seven) days. 06/30/17  Yes Fenton Malling M, PA-C  amoxicillin (AMOXIL) 500 MG capsule Take 1 capsule (500 mg total) by mouth 3 (three) times daily. Patient not taking: Reported on 02/13/2018 01/14/18   Birdie Sons, MD    Allergies Clarithromycin; Colesevelam hcl; and Propoxyphene  Family History  Problem Relation Age  of Onset  . Esophageal cancer Mother   . Alzheimer's disease Father   . Parkinsonism Brother   . Brain cancer Sister   . Alcohol abuse Brother   . Ovarian cancer Other   . Colon cancer Other     Social History Social History   Tobacco Use  . Smoking status: Never Smoker  . Smokeless tobacco: Never Used  Substance Use Topics  . Alcohol use: No  . Drug use: No    Review of Systems Level 5 caveat: Unable to obtain review of systems due to altered mental  status    ____________________________________________   PHYSICAL EXAM:  VITAL SIGNS: ED Triage Vitals  Enc Vitals Group     BP 02/13/18 0619 (!) 163/88     Pulse Rate 02/13/18 0619 94     Resp 02/13/18 0619 17     Temp 02/13/18 0619 98.7 F (37.1 C)     Temp Source 02/13/18 0619 Oral     SpO2 02/13/18 0619 92 %     Weight 02/13/18 0619 121 lb (54.9 kg)     Height 02/13/18 0619 5\' 3"  (1.6 m)     Head Circumference --      Peak Flow --      Pain Score 02/13/18 0618 0     Pain Loc --      Pain Edu? --      Excl. in [place]? --     Constitutional: Alert and oriented x2.  Relatively comfortable appearing. Eyes: Conjunctivae are normal.  EOMI.  PERRLA. Head: Atraumatic. Nose: No congestion/rhinnorhea. Mouth/Throat: Mucous membranes are moist.   Neck: Normal range of motion.  Cardiovascular: Normal rate, regular rhythm. Grossly normal heart sounds.  Good peripheral circulation. Respiratory: Normal respiratory effort.  No retractions. Lungs CTAB. Gastrointestinal: Soft and nontender. No distention.  Genitourinary: No flank tenderness. Musculoskeletal: No lower extremity edema.  Extremities warm and well perfused.  Neurologic:  Normal speech and language.  Motor intact in all extremities.  Normal coordination.  No gross focal neurologic deficits are appreciated.  Skin:  Skin is warm and dry. No rash noted. Psychiatric: Calm and cooperative.  ____________________________________________   LABS (all labs ordered are listed, but only abnormal results are displayed)  Labs Reviewed  BASIC METABOLIC PANEL - Abnormal; Notable for the following components:      Result Value   Glucose, Bld 144 (*)    Creatinine, Ser 1.20 (*)    GFR calc non Af Amer 38 (*)    GFR calc Af Amer 44 (*)    All other components within normal limits  CBC - Abnormal; Notable for the following components:   Hemoglobin 11.9 (*)    All other components within normal limits  URINALYSIS, COMPLETE (UACMP)  WITH MICROSCOPIC - Abnormal; Notable for the following components:   Color, Urine STRAW (*)    APPearance CLEAR (*)    All other components within normal limits  TROPONIN I   ____________________________________________  EKG  ED ECG REPORT I, Arta Silence, the attending physician, personally viewed and interpreted this ECG.  Date: 02/13/2018 EKG Time: 626 Rate: 92 Rhythm: normal sinus rhythm QRS Axis: normal Intervals: normal ST/T Wave abnormalities: Nonspecific lateral and inferior ST flattening/slight depression Narrative Interpretation: no evidence of acute ischemia; no significant change when compared to EKG of 12/29/2017  ____________________________________________  RADIOLOGY  CT head: No acute findings CXR: No focal infiltrate  ____________________________________________   PROCEDURES  Procedure(s) performed: No  Procedures  Critical Care performed: No  ____________________________________________   INITIAL IMPRESSION / ASSESSMENT AND PLAN / ED COURSE  Pertinent labs & imaging results that were available during my care of the patient were reviewed by me and considered in my medical decision making (see chart for details).  84 year old female with PMH as noted above presents with an apparent seizure-like episode, with some postictal confusion.  I reviewed the past medical records in epic.  The family members report that the patient is being worked up for "migraines," and saw Dr. Brigitte Pulse from neurology last month.  He characterizes the episodes in his note as "episodes of confusion, memory loss and hallucinations" for which she is currently working the patient up.  MRI performed on 02/04/2018 shows no significant abnormalities.  On exam, the patient is comfortable appearing, she is alert, vital signs are normal except for hypertension, and her neuro exam is nonfocal.  Overall presentation is most consistent with seizure, however it is possible that patient  could have had a convulsive episode from syncope or other cause such as electrolyte abnormality, or infection such as UTI.  Will obtain CT head, basic labs, UA, and reassess.  ----------------------------------------- 9:33 AM on 02/13/2018 -----------------------------------------  The patient had an episode in the ED where she became unresponsive again and apparently her O2 saturation went down to the 80s.  I did not witness the episode.  The patient is now alert again although she is developing a headache similar to her prior "migraine" headaches.  Given the recurrent episode of unresponsiveness and the concern for recurrent seizures, I believe that the patient would benefit from admission for further monitoring.  The family would prefer patient be admitted as well.  I signed the patient out to the hospitalist Dr. Tressia Miners. ____________________________________________   FINAL CLINICAL IMPRESSION(S) / ED DIAGNOSES  Final diagnoses:  Seizure ([place])      NEW MEDICATIONS STARTED DURING THIS VISIT:  New Prescriptions   No medications on file     Note:  This document was prepared using Dragon voice recognition software and may include unintentional dictation errors.    Arta Silence, MD 02/13/18 [phone]

## 2018-02-13 NOTE — ED Triage Notes (Signed)
Patient arrives via EMS from home where she lives with her husband. The husband was awakened by her shaking in the bed, he was unable to awake her and called EMS. No reported hx of seizures. EMS reports she was post ictal on their arrival to the scene and had dried blood around her mouth, appears to be more alert on arrival to the ED. En route, vitals 96.7, hr 108, 153/80, 96% RA, cbg 151. Unable to establish IV access.

## 2018-02-13 NOTE — ED Notes (Signed)
ED Provider at bedside. 

## 2018-02-13 NOTE — ED Notes (Signed)
Per son, pt goes to sleep fine and wakes up with severe headaches. They used to be once every six months ago but frequency has increased to approx 1x/month recently.

## 2018-02-13 NOTE — H&P (Signed)
[facility] [place] NAME: Amanda Robinson    MR#:  [id number]  DATE OF BIRTH:  06/04/1922  DATE OF ADMISSION:  02/13/2018  PRIMARY CARE PHYSICIAN: Mar Daring, PA-C   REQUESTING/REFERRING PHYSICIAN:   CHIEF COMPLAINT : Headache and seizure   HISTORY OF PRESENT ILLNESS: Amanda Robinson  is a 84 y.o. female with a known history of coronary artery disease, atrial fibrillation, chronic kidney disease, skin cancer was brought to the emergency room by the EMS.  Patient woke up this morning she had a seizure according to the family member she bit her tongue.  She could not tolerate bright light.  Has history of migraines according to the family member.  Patient has this similar episodes in the past too.  She also has on and off headaches for the last couple of months.  Patient has these headaches after she has a seizure according to the family members.  Patient is still active and independent in activities of daily living.  She lives with her husband at home.  She was recently worked up by neurology as an outpatient with an MRI brain which showed no acute abnormality.  CT head today in the emergency room did not show any acute abnormality.  Hospitalist service was consulted for further care.  PAST MEDICAL HISTORY:   Past Medical History:  Diagnosis Date  . Afib ([place])   . CAD (coronary artery disease)   . Chronic kidney disease   . Skin cancer     PAST SURGICAL HISTORY:  Past Surgical History:  Procedure Laterality Date  . ABDOMINAL HYSTERECTOMY  1963  . BREAST SURGERY Bilateral 1985   Biopsy  . CHOLECYSTECTOMY  03/1998  . 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 PARTIAL COLECTOMY  1963    SOCIAL HISTORY:  Social History   Tobacco Use  . Smoking status: Never Smoker  . Smokeless tobacco: Never Used  Substance Use Topics  . Alcohol use: No    FAMILY HISTORY:  Family History  Problem Relation Age of Onset  .  Esophageal cancer Mother   . Alzheimer's disease Father   . Parkinsonism Brother   . Brain cancer Sister   . Alcohol abuse Brother   . Ovarian cancer Other   . Colon cancer Other     DRUG ALLERGIES:  Allergies  Allergen Reactions  . Clarithromycin   . Colesevelam Hcl     Burns stomach.  . Propoxyphene     REVIEW OF SYSTEMS:   CONSTITUTIONAL: No fever, fatigue or weakness.  EYES: No blurred or double vision.  EARS, NOSE, AND THROAT: No tinnitus or ear pain.  RESPIRATORY: No cough, shortness of breath, wheezing or hemoptysis.  CARDIOVASCULAR: No chest pain, orthopnea, edema.  GASTROINTESTINAL: No nausea, vomiting, diarrhea or abdominal pain.  GENITOURINARY: No dysuria, hematuria.  ENDOCRINE: No polyuria, nocturia,  HEMATOLOGY: No anemia, easy bruising or bleeding SKIN: No rash or lesion. MUSCULOSKELETAL: No joint pain or arthritis.   NEUROLOGIC: No tingling, numbness, weakness. Had seizure Had headache  PSYCHIATRY: No anxiety or depression.   MEDICATIONS AT HOME:  Prior to Admission medications   Medication Sig Start Date End Date Taking? Authorizing Provider  apixaban (ELIQUIS) 2.5 MG TABS tablet Take 1 tablet (2.5 mg total) by mouth 2 (two) times daily. 11/24/17  Yes Minna Merritts, MD  metoprolol tartrate (LOPRESSOR) 25 MG tablet Take 1 tablet (25 mg total) by mouth 2 (two) times  daily. 11/24/17  Yes Gollan, Kathlene November, MD  pantoprazole (PROTONIX) 40 MG tablet TAKE 1 TABLET (40 MG TOTAL) BY MOUTH DAILY. 09/14/17  Yes Fenton Malling M, PA-C  PARoxetine (PAXIL) 20 MG tablet Take 1 tablet (20 mg total) by mouth daily. 10/26/17  Yes Mar Daring, PA-C  rosuvastatin (CRESTOR) 20 MG tablet Take 1 tablet (20 mg total) by mouth daily. 01/14/18  Yes Birdie Sons, MD  Vitamin D, Ergocalciferol, (DRISDOL) 50000 units CAPS capsule Take 1 capsule (50,000 Units total) by mouth every 7 (seven) days. 06/30/17  Yes Fenton Malling M, PA-C  amoxicillin (AMOXIL) 500 MG  capsule Take 1 capsule (500 mg total) by mouth 3 (three) times daily. Patient not taking: Reported on 02/13/2018 01/14/18   Birdie Sons, MD      PHYSICAL EXAMINATION:   VITAL SIGNS: Blood pressure (!) 167/71, pulse 73, temperature 98.7 F (37.1 C), temperature source Oral, resp. rate 20, height 5\' 3"  (1.6 m), weight 54.9 kg (121 lb), SpO2 98 %.  GENERAL:  84 y.o.-year-old patient lying in the bed with no acute distress.  EYES: Pupils equal, round, reactive to light and accommodation. No scleral icterus. Extraocular muscles intact.  HEENT: Head atraumatic, normocephalic. Oropharynx and nasopharynx clear.  NECK:  Supple, no jugular venous distention. No thyroid enlargement, no tenderness.  LUNGS: Normal breath sounds bilaterally, no wheezing, rales,rhonchi or crepitation. No use of accessory muscles of respiration.  CARDIOVASCULAR: S1, S2 normal. No murmurs, rubs, or gallops.  ABDOMEN: Soft, nontender, nondistended. Bowel sounds present. No organomegaly or mass.  EXTREMITIES: No pedal edema, cyanosis, or clubbing.  NEUROLOGIC: Cranial nerves II through XII are intact. Muscle strength 5/5 in all extremities. Sensation intact. Gait not checked.  PSYCHIATRIC: The patient is alert and oriented x 3.  SKIN: No obvious rash, lesion, or ulcer.   LABORATORY PANEL:   CBC Recent Labs  Lab 02/13/18 0626  WBC 6.4  HGB 11.9*  HCT 35.5  PLT 208  MCV 90.1  MCH 30.1  MCHC 33.4  RDW 13.8   ------------------------------------------------------------------------------------------------------------------  Chemistries  Recent Labs  Lab 02/13/18 0626  NA 141  K 3.8  CL 106  CO2 22  GLUCOSE 144*  BUN 16  CREATININE 1.20*  CALCIUM 9.3   ------------------------------------------------------------------------------------------------------------------ estimated creatinine clearance is 24.2 mL/min (A) (by C-G formula based on SCr of 1.2 mg/dL  (H)). ------------------------------------------------------------------------------------------------------------------ No results for input(s): TSH, T4TOTAL, T3FREE, THYROIDAB in the last 72 hours.  Invalid input(s): FREET3   Coagulation profile No results for input(s): INR, PROTIME in the last 168 hours. ------------------------------------------------------------------------------------------------------------------- No results for input(s): DDIMER in the last 72 hours. -------------------------------------------------------------------------------------------------------------------  Cardiac Enzymes Recent Labs  Lab 02/13/18 0840  TROPONINI <0.03   ------------------------------------------------------------------------------------------------------------------ Invalid input(s): POCBNP  ---------------------------------------------------------------------------------------------------------------  Urinalysis    Component Value Date/Time   COLORURINE STRAW (A) 02/13/2018 0840   APPEARANCEUR CLEAR (A) 02/13/2018 0840   LABSPEC 1.011 02/13/2018 0840   PHURINE 8.0 02/13/2018 0840   GLUCOSEU NEGATIVE 02/13/2018 0840   HGBUR NEGATIVE 02/13/2018 0840   BILIRUBINUR NEGATIVE 02/13/2018 0840   BILIRUBINUR Trace 01/14/2018 0913   KETONESUR NEGATIVE 02/13/2018 0840   PROTEINUR NEGATIVE 02/13/2018 0840   UROBILINOGEN 0.2 01/14/2018 0913   NITRITE NEGATIVE 02/13/2018 0840   LEUKOCYTESUR NEGATIVE 02/13/2018 0840     RADIOLOGY: Ct Head Wo Contrast  Result Date: 02/13/2018 CLINICAL DATA:  Per family pt with episode of shaking and not being able to be woken up by her husband. Pt family states that she has  had episodes like this, but not to the severity of this one. Family denies any trauma. EXAM: CT HEAD WITHOUT CONTRAST TECHNIQUE: Contiguous axial images were obtained from the base of the skull through the vertex without intravenous contrast. COMPARISON:  Brain MRI, 02/04/2018  FINDINGS: Brain: No evidence of acute infarction, hemorrhage, hydrocephalus, extra-axial collection or mass lesion/mass effect. There is age appropriate ventricular and sulcal enlargement. Mild patchy white matter hypoattenuation is also noted consistent with chronic microvascular ischemic change. Vascular: No hyperdense vessel or unexpected calcification. Skull: Normal. Negative for fracture or focal lesion. Sinuses/Orbits: Visualized globes and orbits are unremarkable. Visualized sinuses and mastoid air cells are clear. Other: None. IMPRESSION: 1. No acute intracranial abnormalities. 2. Age related volume loss and mild chronic microvascular ischemic change, stable compared to the recent prior brain MRI. Electronically Signed   By: Lajean Manes M.D.   On: 02/13/2018 07:47   Dg Chest Portable 1 View  Result Date: 02/13/2018 CLINICAL DATA:  Probable seizure.  Hypoxia. EXAM: PORTABLE CHEST 1 VIEW COMPARISON:  October 15, 2017 FINDINGS: The heart size and mediastinal contours are within normal limits. Both lungs are clear. The visualized skeletal structures are unremarkable. IMPRESSION: No active disease. Electronically Signed   By: Dorise Bullion III M.D   On: 02/13/2018 08:46    EKG: Orders placed or performed during the hospital encounter of 02/13/18  . ED EKG  . ED EKG  . EKG 12-Lead  . EKG 12-Lead  . EKG 12-Lead  . EKG 12-Lead  . EKG 12-Lead  . EKG 12-Lead  . EKG 12-Lead  . EKG 12-Lead    IMPRESSION AND PLAN:  84 year old elderly female patient with history of coronary disease, atrial fibrillation, chronic kidney disease, skin cancer presented to the emergency room with seizure and headaches.  -Seizure EEG to assess for seizures Watch for any new episodes of seizures Neurology consultation Start patient on Keppra 500 mg IV every 12 hourly  -Headaches post seizure PRN Tylenol for headache  could be from migraine  -Complex migraine attacks  neurology evaluation  -Chronic atrial  fibrillation Rate under control  -DVT prophylaxis subcu Lovenox daily  All the records are reviewed and case discussed with ED provider. Management plans discussed with the patient, family and they are in agreement.  CODE STATUS:Full code    TOTAL TIME TAKING CARE OF THIS PATIENT: 53 minutes.    Saundra Shelling M.D on 02/13/2018 at 11:24 AM  Between 7am to 6pm - Pager - [phone]  After 6pm go to [email or web address] - password [email or web address] [facility]  Office  [phone]  CC: Primary care physician; Mar Daring, PA-C

## 2018-02-14 DIAGNOSIS — G40909 Epilepsy, unspecified, not intractable, without status epilepticus: Principal | ICD-10-CM

## 2018-02-14 LAB — BASIC METABOLIC PANEL
Anion gap: 6 (ref 5–15)
BUN: 16 mg/dL (ref 6–20)
CHLORIDE: 108 mmol/L (ref 101–111)
CO2: 23 mmol/L (ref 22–32)
CREATININE: 1.16 mg/dL — AB (ref 0.44–1.00)
Calcium: 8.4 mg/dL — ABNORMAL LOW (ref 8.9–10.3)
GFR calc non Af Amer: 39 mL/min — ABNORMAL LOW (ref >60)
GFR, EST AFRICAN AMERICAN: 46 mL/min — AB (ref >60)
GLUCOSE: 95 mg/dL (ref 65–99)
Potassium: 3.9 mmol/L (ref 3.5–5.1)
Sodium: 137 mmol/L (ref 135–145)

## 2018-02-14 LAB — CBC
HCT: 31 % — ABNORMAL LOW (ref 35.0–47.0)
HEMOGLOBIN: 10.4 g/dL — AB (ref 12.0–16.0)
MCH: 30.4 pg (ref 26.0–34.0)
MCHC: 33.4 g/dL (ref 32.0–36.0)
MCV: 91 fL (ref 80.0–100.0)
PLATELETS: 172 10*3/uL (ref 150–440)
RBC: 3.41 MIL/uL — AB (ref 3.80–5.20)
RDW: 13.8 % (ref 11.5–14.5)
WBC: 6.4 10*3/uL (ref 3.6–11.0)

## 2018-02-14 NOTE — Consult Note (Addendum)
STROKE TEAM PROGRESS NOTE   HPI: Amanda Robinson  is a 84 y.o. female with a known history of coronary artery disease, atrial fibrillation, chronic kidney disease, skin cancer, migraine headaches being followed outpatient by neurology for migraines and complex migraine episodes. She was brought to the emergency room by the EMS.  Patient woke up this morning she was shaking in the bed according to the family member she bit her tongue.  She could not tolerate bright light.   Patient has this similar episodes in the past too.  She also has on and off headaches for the last couple of months.  Patient has these headaches after she has a seizure according to the family members.  Patient is still active and independent in activities of daily living.  She lives with her husband at home.  She was recently worked up by neurology as an outpatient with an MRI brain which showed no acute abnormality.   Family at bedside. They think seizure causes headaches. She bith er tongue. Seizures happen at night and she wakes with a headache. She is back to baseline.       OBJECTIVE Vitals:   02/13/18 2016 02/13/18 2147 02/14/18 0546 02/14/18 0941  BP: (!) 121/57 (!) 121/57 (!) 145/67 (!) 118/46  Pulse: (!) 52 60 (!) 58 (!) 55  Resp: 20  16   Temp: 98.8 F (37.1 C)  98 F (36.7 C)   TempSrc: Oral  Oral   SpO2: 96%  96%   Weight:      Height:        CBC:  Recent Labs  Lab 02/13/18 0626 02/14/18 0754  WBC 6.4 6.4  HGB 11.9* 10.4*  HCT 35.5 31.0*  MCV 90.1 91.0  PLT 208 161    Basic Metabolic Panel:  Recent Labs  Lab 02/13/18 0626 02/14/18 0754  NA 141 137  K 3.8 3.9  CL 106 108  CO2 22 23  GLUCOSE 144* 95  BUN 16 16  CREATININE 1.20* 1.16*  CALCIUM 9.3 8.4*    Lipid Panel:     Component Value Date/Time   CHOL 216 (H) 12/22/2017 0842   TRIG 110 12/22/2017 0842   HDL 75 12/22/2017 0842   CHOLHDL 2.9 12/22/2017 0842   CHOLHDL 3.1 06/29/2017 0851   LDLCALC 119 (H) 12/22/2017 0842   LDLCALC 134 (H) 06/29/2017 0851   HgbA1c: No results found for: HGBA1C Urine Drug Screen: No results found for: LABOPIA, COCAINSCRNUR, LABBENZ, AMPHETMU, THCU, LABBARB  Alcohol Level No results found for: ETH  IMAGING  Ct Head Wo Contrast  Result Date: 02/13/2018 CLINICAL DATA:  Per family pt with episode of shaking and not being able to be woken up by her husband. Pt family states that she has had episodes like this, but not to the severity of this one. Family denies any trauma. EXAM: CT HEAD WITHOUT CONTRAST TECHNIQUE: Contiguous axial images were obtained from the base of the skull through the vertex without intravenous contrast. COMPARISON:  Brain MRI, 02/04/2018 FINDINGS: Brain: No evidence of acute infarction, hemorrhage, hydrocephalus, extra-axial collection or mass lesion/mass effect. There is age appropriate ventricular and sulcal enlargement. Mild patchy white matter hypoattenuation is also noted consistent with chronic microvascular ischemic change. Vascular: No hyperdense vessel or unexpected calcification. Skull: Normal. Negative for fracture or focal lesion. Sinuses/Orbits: Visualized globes and orbits are unremarkable. Visualized sinuses and mastoid air cells are clear. Other: None. IMPRESSION: 1. No acute intracranial abnormalities. 2. Age related volume loss and mild chronic  microvascular ischemic change, stable compared to the recent prior brain MRI. Electronically Signed   By: Lajean Manes M.D.   On: 02/13/2018 07:47   Dg Chest Portable 1 View  Result Date: 02/13/2018 CLINICAL DATA:  Probable seizure.  Hypoxia. EXAM: PORTABLE CHEST 1 VIEW COMPARISON:  October 15, 2017 FINDINGS: The heart size and mediastinal contours are within normal limits. Both lungs are clear. The visualized skeletal structures are unremarkable. IMPRESSION: No active disease. Electronically Signed   By: Dorise Bullion III M.D   On: 02/13/2018 08:46   Physical exam: Exam: Gen: NAD, conversant, alert                  CV: RRR, no MRG. No Carotid Bruits. No peripheral edema, warm, nontender Eyes: Conjunctivae clear without exudates or hemorrhage  Neuro: Detailed Neurologic Exam  Speech:    Speech is normal; fluent and spontaneous with normal comprehension.  Cognition:    The patient is oriented to person, place, and time;    Cranial Nerves:    The pupils are equal, round, and reactive to light. Attempted fundoscopic exam could not visualize. . Visual fields are full to finger confrontation. Extraocular movements are intact. Trigeminal sensation is intact and the muscles of mastication are normal. The face is symmetric. The palate elevates in the midline. Hearing significantly imaired. Voice is normal. Shoulder shrug is normal. The tongue has normal motion without fasciculations.   Coordination:    Normal finger to nose  Gait:    Deferred  Motor Observation:    No asymmetry, no atrophy, and no involuntary movements noted. Tone:    Normal muscle tone.     Strength:    Strength is antigravity and equal without drift in the upper and lower limbs.      Sensation: intact to LT     Reflex Exam:  DTR's:    Deep tendon reflexes in the upper and lower extremities are symmetrical bilaterally.   Toes:    The toes are equiv bilaterally.   Clonus:    Clonus is absent.    ASSESSMENT/PLAN Ms. Amanda Robinson is a 84 y.o. female with history of coronary artery disease, atrial fibrillation, chronic kidney disease, skin cancer, migraine headaches being followed outpatient by neurology for migraines and complex migraine episodes. Patient had witnessed generalized seizure, has had similar events in the past.   Patient has this similar episodes in the past too  - Now back to baseline on Keppra. Continue Keppra outpatient - Seizure precautions - Has follow up with neurology scheduled early July -  I think patient can go home today and get EEG outpatient but she has an EEG ordered for the morning, will  leave decision up to primary team whether to discharge today or keep for eeg tomorrow. - Patient is unable to drive, operate heavy machinery, perform activities at heights or participate in water activities until 6 months seizure free - Discussed Patients with epilepsy have a small risk of sudden unexpected death, a condition referred to as sudden unexpected death in epilepsy (SUDEP). SUDEP is defined specifically as the sudden, unexpected, witnessed or unwitnessed, nontraumatic and nondrowning death in patients with epilepsy with or without evidence for a seizure, and excluding documented status epilepticus, in which post mortem examination does not reveal a structural or toxicologic cause for death    Sarina Ill [facility]

## 2018-02-14 NOTE — Progress Notes (Signed)
[facility] [place] NAME: Amanda Robinson    MR#:  [id number]  DATE OF BIRTH:  Sep 11, 1922  SUBJECTIVE:  CHIEF COMPLAINT: Patient is resting comfortably.  Son and cousin at bedside.  Seen by neurology.  Denies any headache today  REVIEW OF SYSTEMS:  CONSTITUTIONAL: No fever, fatigue or weakness.  EYES: No blurred or double vision.  EARS, NOSE, AND THROAT: No tinnitus or ear pain.  RESPIRATORY: No cough, shortness of breath, wheezing or hemoptysis.  CARDIOVASCULAR: No chest pain, orthopnea, edema.  GASTROINTESTINAL: No nausea, vomiting, diarrhea or abdominal pain.  GENITOURINARY: No dysuria, hematuria.  ENDOCRINE: No polyuria, nocturia,  HEMATOLOGY: No anemia, easy bruising or bleeding SKIN: No rash or lesion. MUSCULOSKELETAL: No joint pain or arthritis.   NEUROLOGIC: No tingling, numbness, weakness.  PSYCHIATRY: No anxiety or depression.   DRUG ALLERGIES:   Allergies  Allergen Reactions  . Clarithromycin   . Colesevelam Hcl     Burns stomach.  . Propoxyphene     VITALS:  Blood pressure (!) 146/58, pulse 60, temperature 99 F (37.2 C), temperature source Oral, resp. rate 20, height 5\' 3"  (1.6 m), weight 54.9 kg (121 lb), SpO2 96 %.  PHYSICAL EXAMINATION:  GENERAL:  84 y.o.-year-old patient lying in the bed with no acute distress.  EYES: Pupils equal, round, reactive to light and accommodation. No scleral icterus. Extraocular muscles intact.  HEENT: Head atraumatic, normocephalic. Oropharynx and nasopharynx clear.  NECK:  Supple, no jugular venous distention. No thyroid enlargement, no tenderness.  LUNGS: Normal breath sounds bilaterally, no wheezing, rales,rhonchi or crepitation. No use of accessory muscles of respiration.  CARDIOVASCULAR: S1, S2 normal. No murmurs, rubs, or gallops.  ABDOMEN: Soft, nontender, nondistended. Bowel sounds present. No organomegaly or mass.  EXTREMITIES: No pedal edema, cyanosis, or clubbing.   NEUROLOGIC: Cranial nerves II through XII are intact. Muscle strength 5/5 in all extremities. Sensation intact. Gait not checked.  PSYCHIATRIC: The patient is alert and oriented x 3.  SKIN: No obvious rash, lesion, or ulcer.    LABORATORY PANEL:   CBC Recent Labs  Lab 02/14/18 0754  WBC 6.4  HGB 10.4*  HCT 31.0*  PLT 172   ------------------------------------------------------------------------------------------------------------------  Chemistries  Recent Labs  Lab 02/14/18 0754  NA 137  K 3.9  CL 108  CO2 23  GLUCOSE 95  BUN 16  CREATININE 1.16*  CALCIUM 8.4*   ------------------------------------------------------------------------------------------------------------------  Cardiac Enzymes Recent Labs  Lab 02/13/18 0840  TROPONINI <0.03   ------------------------------------------------------------------------------------------------------------------  RADIOLOGY:  Ct Head Wo Contrast  Result Date: 02/13/2018 CLINICAL DATA:  Per family pt with episode of shaking and not being able to be woken up by her husband. Pt family states that she has had episodes like this, but not to the severity of this one. Family denies any trauma. EXAM: CT HEAD WITHOUT CONTRAST TECHNIQUE: Contiguous axial images were obtained from the base of the skull through the vertex without intravenous contrast. COMPARISON:  Brain MRI, 02/04/2018 FINDINGS: Brain: No evidence of acute infarction, hemorrhage, hydrocephalus, extra-axial collection or mass lesion/mass effect. There is age appropriate ventricular and sulcal enlargement. Mild patchy white matter hypoattenuation is also noted consistent with chronic microvascular ischemic change. Vascular: No hyperdense vessel or unexpected calcification. Skull: Normal. Negative for fracture or focal lesion. Sinuses/Orbits: Visualized globes and orbits are unremarkable. Visualized sinuses and mastoid air cells are clear. Other: None. IMPRESSION: 1. No acute  intracranial abnormalities. 2. Age related volume loss and mild chronic microvascular ischemic  change, stable compared to the recent prior brain MRI. Electronically Signed   By: Lajean Manes M.D.   On: 02/13/2018 07:47   Dg Chest Portable 1 View  Result Date: 02/13/2018 CLINICAL DATA:  Probable seizure.  Hypoxia. EXAM: PORTABLE CHEST 1 VIEW COMPARISON:  October 15, 2017 FINDINGS: The heart size and mediastinal contours are within normal limits. Both lungs are clear. The visualized skeletal structures are unremarkable. IMPRESSION: No active disease. Electronically Signed   By: Dorise Bullion III M.D   On: 02/13/2018 08:46    EKG:   Orders placed or performed during the hospital encounter of 02/13/18  . ED EKG  . ED EKG  . EKG 12-Lead  . EKG 12-Lead  . EKG 12-Lead  . EKG 12-Lead  . EKG 12-Lead  . EKG 12-Lead  . EKG 12-Lead  . EKG 12-Lead    ASSESSMENT AND PLAN:   84 year old elderly female patient with history of coronary disease, atrial fibrillation, chronic kidney disease, skin cancer presented to the emergency room with seizure and headaches.  -Seizure EEG to assess for seizures, family prefers getting inpatient EEG Watch for any new episodes of seizures Neurology consultation Started patient on Keppra 500 mg IV every 12 hourly, neurology is recommending to continue [place] outpatient Seizure precautions-patient is unable to drive, operate heavy machinery, perform activities at heights or participate in water activities until 6 months seizure-free  -Headaches post seizure PRN Tylenol for headache  could be from migraine  -Complex migraine attacks  neurology evaluation, continue current meds and outpatient neurology follow-up  -Chronic atrial fibrillation Rate under control  -DVT prophylaxis subcu Lovenox daily       All the records are reviewed and case discussed with Care Management/Social Workerr. Management plans discussed with the patient, family and they  are in agreement.  CODE STATUS: fc  TOTAL TIME TAKING CARE OF THIS PATIENT: 2minutes.   POSSIBLE D/C IN 1-2 DAYS, DEPENDING ON CLINICAL CONDITION.  Note: This dictation was prepared with Dragon dictation along with smaller phrase technology. Any transcriptional errors that result from this process are unintentional.   Nicholes Mango M.D on 02/14/2018 at 10:00 PM  Between 7am to 6pm - Pager - [phone] After 6pm go to [email or web address] - password [email or web address] [email or web address]  Office  [phone]  CC: Primary care physician; Mar Daring, PA-C

## 2018-02-15 ENCOUNTER — Inpatient Hospital Stay: Payer: Medicare HMO

## 2018-02-15 DIAGNOSIS — R569 Unspecified convulsions: Secondary | ICD-10-CM

## 2018-02-15 MED ORDER — BUTALBITAL-APAP-CAFFEINE 50-325-40 MG PO TABS: 1.0000 | ORAL_TABLET | ORAL | 0 refills | Status: DC | PRN

## 2018-02-15 MED ORDER — SENNOSIDES-DOCUSATE SODIUM 8.6-50 MG PO TABS: 1.0000 | ORAL_TABLET | Freq: Every evening | ORAL | Status: AC | PRN

## 2018-02-15 MED ORDER — LEVETIRACETAM 500 MG PO TABS: 500.0000 mg | ORAL_TABLET | Freq: Two times a day (BID) | ORAL | 0 refills | Status: DC

## 2018-02-15 NOTE — Progress Notes (Signed)
eeg completed ° °

## 2018-02-15 NOTE — Procedures (Signed)
ELECTROENCEPHALOGRAM REPORT   [name]       Room #: 225A-AA EEG No. ID: [id number] Age: 84 y.o.        Sex: female Referring Physician: Gouru Report Date:  02/15/2018        Interpreting Physician: Alexis Goodell  History: Leroy P Mota is an 84 y.o. female with new onset seizures  Medications:  Keppra  Conditions of Recording:  This is a 16 channel EEG carried out with the patient in the awake and drowsy states.  Description:  The waking background activity consists of a low voltage, symmetrical, fairly well organized, 8-9 Hz alpha activity, seen from the parieto-occipital and posterior temporal regions.  Low voltage fast activity, poorly organized, is seen anteriorly and is at times superimposed on more posterior regions.  A mixture of theta and alpha rhythms are seen from the central and temporal regions. The patient drowses with slowing to irregular, low voltage theta and beta activity.   Stage II sleep is not obtained. Hyperventilation was not performed.  Intermittent photic stimulation was performed but failed to illicit any change in the tracing.    IMPRESSION: Normal electroencephalogram, awake, drowsy and with activation procedures. There are no focal lateralizing or epileptiform features.   Alexis Goodell, MD Neurology [phone] 02/15/2018, 5:46 PM

## 2018-02-15 NOTE — Progress Notes (Signed)
Subjective: Patient reports that she is back to baseline.  Tolerating Keppra without noted side effects.    Objective: Current vital signs: BP (!) 177/68   Pulse (!) 58   Temp 98.2 F (36.8 C) (Oral)   Resp 17   Ht 5\' 3"  (1.6 m)   Wt 54.9 kg (121 lb)   SpO2 96%   BMI 21.43 kg/m  Vital signs in last 24 hours: Temp:  [98.2 F (36.8 C)-99 F (37.2 C)] 98.2 F (36.8 C) (06/03 0636) Pulse Rate:  [57-60] 58 (06/03 1051) Resp:  [17-20] 17 (06/03 0636) BP: (146-177)/(58-77) 177/68 (06/03 1051) SpO2:  [96 %] 96 % (06/03 0636)  Intake/Output from previous day: 06/02 0701 - 06/03 0700 In: 1995 [P.O.:240; I.V.:1755] Out: 1800 [Urine:1800] Intake/Output this shift: Total I/O In: 240 [P.O.:240] Out: 0  Nutritional status:  Diet Order           Diet Heart Room service appropriate? Yes; Fluid consistency: Thin  Diet effective now          Neurologic Exam: Mental Status: Alert, oriented, thought content appropriate.  Speech fluent without evidence of aphasia.  Able to follow 3 step commands without difficulty. Cranial Nerves: II: Discs flat bilaterally; Visual fields grossly normal, pupils equal, round, reactive to light and accommodation III,IV, VI: ptosis not present, extra-ocular motions intact bilaterally V,VII: smile symmetric, facial light touch sensation normal bilaterally VIII: hearing normal bilaterally IX,X: gag reflex present XI: bilateral shoulder shrug XII: midline tongue extension Motor: Patient able to lift all extremities against gravity with no focal weakness noted. Sensory: Pinprick and light touch intact throughout, bilaterally  Lab Results: Basic Metabolic Panel: Recent Labs  Lab 02/13/18 0626 02/14/18 0754  NA 141 137  K 3.8 3.9  CL 106 108  CO2 22 23  GLUCOSE 144* 95  BUN 16 16  CREATININE 1.20* 1.16*  CALCIUM 9.3 8.4*    Liver Function Tests: No results for input(s): AST, ALT, ALKPHOS, BILITOT, PROT, ALBUMIN in the last 168 hours. No  results for input(s): LIPASE, AMYLASE in the last 168 hours. No results for input(s): AMMONIA in the last 168 hours.  CBC: Recent Labs  Lab 02/13/18 0626 02/14/18 0754  WBC 6.4 6.4  HGB 11.9* 10.4*  HCT 35.5 31.0*  MCV 90.1 91.0  PLT 208 172    Cardiac Enzymes: Recent Labs  Lab 02/13/18 0840  TROPONINI <0.03    Lipid Panel: No results for input(s): CHOL, TRIG, HDL, CHOLHDL, VLDL, LDLCALC in the last 168 hours.  CBG: No results for input(s): GLUCAP in the last 168 hours.  Microbiology: Results for orders placed or performed in visit on 01/14/18  CULTURE, URINE COMPREHENSIVE     Status: None   Collection Time: 01/14/18  9:14 AM  Result Value Ref Range Status   Urine Culture, Comprehensive Final report  Final   Organism ID, Bacteria Comment  Final    Comment: Mixed urogenital flora 6,000  Colonies/mL     Coagulation Studies: No results for input(s): LABPROT, INR in the last 72 hours.  Imaging: No results found.  Medications: I have reviewed the patient's current medications.  Assessment/Plan: Patient back to baseline.  Tolerating Keppra without noted side effects.   Recommendations: 1. Continue Keppra at current dose 2. Follow up with neurology on an outpatient basis 3. Patient unable to drive, operate heavy machinery, perform activities at heights and participate in water activities until release by outpatient physician.    LOS: 2 days   Alexis Goodell,  MD Neurology [phone] 02/15/2018  5:41 PM

## 2018-02-15 NOTE — Discharge Instructions (Signed)
° °  Seizure, Adult When you have a seizure:  Parts of your body may move.  How aware or awake (conscious) you are may change.  You may shake (convulse).  Some people have symptoms right before a seizure happens. These symptoms may include:  Fear.  Worry (anxiety).  Feeling like you are going to throw up (nausea).  Feeling like the room is spinning (vertigo).  Feeling like you saw or heard something before (deja vu).  Odd tastes or smells.  Changes in vision, such as seeing flashing lights or spots.  Seizures usually last from 30 seconds to 2 minutes. Usually, they are not harmful unless they last a long time. Follow these instructions at home: Medicines  Take over-the-counter and prescription medicines only as told by your doctor.  Avoid anything that may keep your medicine from working, such as alcohol. Activity  Do not do any activities that would be dangerous if you had another seizure, like driving or swimming. Wait until your doctor approves.  If you live in the [place] ask your local DMV (department of motor vehicles) when you can drive.  Rest. Teaching others  Teach friends and family what to do when you have a seizure. They should: ? Lay you on the ground. ? Protect your head and body. ? Loosen any tight clothing around your neck. ? Turn you on your side. ? Stay with you until you are better. ? Not hold you down. ? Not put anything in your mouth. ? Know whether or not you need emergency care. General instructions  Contact your doctor each time you have a seizure.  Avoid anything that gives you seizures.  Keep a seizure diary. Write down: ? What you think caused each seizure. ? What you remember about each seizure.  Keep all follow-up visits as told by your doctor. This is important. Contact a doctor if:  You have another seizure.  You have seizures more often.  There is any change in what happens during your seizures.  You continue to have  seizures with treatment.  You have symptoms of being sick or having an infection. Get help right away if:  You have a seizure: ? That lasts longer than 5 minutes. ? That is different than seizures you had before. ? That makes it harder to breathe. ? After you hurt your head.  After a seizure, you cannot speak or use a part of your body.  After a seizure, you are confused or have a bad headache.  You have two or more seizures in a row.  You are having seizures more often.  You do not wake up right after a seizure.  You get hurt during a seizure. In an emergency:  These symptoms may be an emergency. Do not wait to see if the symptoms will go away. Get medical help right away. Call your local emergency services (911 in the [place] Do not drive yourself to the hospital. This information is not intended to replace advice given to you by your health care provider. Make sure you discuss any questions you have with your health care provider. Document Released: 02/18/2008 Document Revised: 05/14/2016 Document Reviewed: 05/14/2016 Elsevier Interactive Patient Education  2017 [place] with primary care physician in 1 week  follow-up with [facility] neurology in 2 weeks patient is unable to drive, operate heavy machinery, perform activities at heights or participate in water activities until 6 months seizure-free

## 2018-02-15 NOTE — Discharge Summary (Signed)
[place] [facility] [place] NAME: Amanda Robinson    MR#:  [id number]  DATE OF BIRTH:  1922-07-14  DATE OF ADMISSION:  02/13/2018 ADMITTING PHYSICIAN: Saundra Shelling, MD  DATE OF DISCHARGE: 02/15/18  PRIMARY CARE PHYSICIAN: Mar Daring, PA-C    ADMISSION DIAGNOSIS:  Seizure ([place]) [R56.9]  DISCHARGE DIAGNOSIS:  Active Problems:   Altered mental status Seizures   SECONDARY DIAGNOSIS:   Past Medical History:  Diagnosis Date  . Afib ([place])   . CAD (coronary artery disease)   . Chronic kidney disease   . Skin cancer     HOSPITAL COURSE:   HISTORY OF PRESENT ILLNESS: Amanda Robinson  is a 84 y.o. female with a known history of coronary artery disease, atrial fibrillation, chronic kidney disease, skin cancer was brought to the emergency room by the EMS.  Patient woke up this morning she had a seizure according to the family member she bit her tongue.  She could not tolerate bright light.  Has history of migraines according to the family member.  Patient has this similar episodes in the past too.  She also has on and off headaches for the last couple of months.  Patient has these headaches after she has a seizure according to the family members.  Patient is still active and independent in activities of daily living.  She lives with her husband at home.  She was recently worked up by neurology as an outpatient with an MRI brain which showed no acute abnormality.  CT head today in the emergency room did not show any acute abnormality.  Hospitalist service was consulted for further care.  -Seizure EEG to assess for seizures is done, okay to discharge patient from neurology standpoint, outpatient follow-up with neurology as recommended No other episodes of seizures during the hospital course Started patient on Keppra 500 mg IV every 12 hourly, neurology is recommending to continue [place] outpatient Seizure precautions-patient is unable to drive,  operate heavy machinery, perform activities at heights or participate in water activities until 6 months seizure-free  -Headaches post seizure PRN Tylenol for headache  could be from migraine  -Complex migraine attacks neurology evaluation, continue current meds and outpatient neurology follow-up  -Chronic atrial fibrillation Rate under control  -DVT prophylaxis subcu Lovenox daily    DISCHARGE CONDITIONS:   STABLE   CONSULTS OBTAINED:  Treatment Team:  Melvenia Beam, MD Alexis Goodell, MD   PROCEDURES EEG   DRUG ALLERGIES:   Allergies  Allergen Reactions  . Clarithromycin   . Colesevelam Hcl     Burns stomach.  . Propoxyphene     DISCHARGE MEDICATIONS:   Allergies as of 02/15/2018      Reactions   Clarithromycin    Colesevelam Hcl    Burns stomach.   Propoxyphene       Medication List    STOP taking these medications   amoxicillin 500 MG capsule Commonly known as:  AMOXIL     TAKE these medications   apixaban 2.5 MG Tabs tablet Commonly known as:  ELIQUIS Take 1 tablet (2.5 mg total) by mouth 2 (two) times daily.   butalbital-acetaminophen-caffeine 50-325-40 MG tablet Commonly known as:  FIORICET, ESGIC Take 1 tablet by mouth every 4 (four) hours as needed for headache or migraine.   levETIRAcetam 500 MG tablet Commonly known as:  KEPPRA Take 1 tablet (500 mg total) by mouth 2 (two) times daily.   metoprolol tartrate 25 MG tablet Commonly  known as:  LOPRESSOR Take 1 tablet (25 mg total) by mouth 2 (two) times daily.   pantoprazole 40 MG tablet Commonly known as:  PROTONIX TAKE 1 TABLET (40 MG TOTAL) BY MOUTH DAILY.   PARoxetine 20 MG tablet Commonly known as:  PAXIL Take 1 tablet (20 mg total) by mouth daily.   rosuvastatin 20 MG tablet Commonly known as:  CRESTOR Take 1 tablet (20 mg total) by mouth daily.   senna-docusate 8.6-50 MG tablet Commonly known as:  Senokot-S Take 1 tablet by mouth at bedtime as needed for mild  constipation.   Vitamin D (Ergocalciferol) 50000 units Caps capsule Commonly known as:  DRISDOL Take 1 capsule (50,000 Units total) by mouth every 7 (seven) days.        DISCHARGE INSTRUCTIONS:   Follow-up with primary care physician in 1 week  follow-up with [facility] neurology in 2 weeks patient is unable to drive, operate heavy machinery, perform activities at heights or participate in water activities until 6 months seizure-free    DIET:  Cardiac diet  DISCHARGE CONDITION:  Fair  ACTIVITY:  Activity as tolerated  OXYGEN:  Home Oxygen: No.   Oxygen Delivery: room air  DISCHARGE LOCATION:  home   If you experience worsening of your admission symptoms, develop shortness of breath, life threatening emergency, suicidal or homicidal thoughts you must seek medical attention immediately by calling 911 or calling your MD immediately  if symptoms less severe.  You Must read complete instructions/literature along with all the possible adverse reactions/side effects for all the Medicines you take and that have been prescribed to you. Take any new Medicines after you have completely understood and accpet all the possible adverse reactions/side effects.   Please note  You were cared for by a hospitalist during your hospital stay. If you have any questions about your discharge medications or the care you received while you were in the hospital after you are discharged, you can call the unit and asked to speak with the hospitalist on call if the hospitalist that took care of you is not available. Once you are discharged, your primary care physician will handle any further medical issues. Please note that NO REFILLS for any discharge medications will be authorized once you are discharged, as it is imperative that you return to your primary care physician (or establish a relationship with a primary care physician if you do not have one) for your aftercare needs so that they can reassess your need  for medications and monitor your lab values.     Today  Chief Complaint  Patient presents with  . Altered Mental Status   Patient is resting comfortably.  Very eager to go home.  Son is at bedside  ROS:  CONSTITUTIONAL: Denies fevers, chills. Denies any fatigue, weakness.  EYES: Denies blurry vision, double vision, eye pain. EARS, NOSE, THROAT: Denies tinnitus, ear pain, hearing loss. RESPIRATORY: Denies cough, wheeze, shortness of breath.  CARDIOVASCULAR: Denies chest pain, palpitations, edema.  GASTROINTESTINAL: Denies nausea, vomiting, diarrhea, abdominal pain. Denies bright red blood per rectum. GENITOURINARY: Denies dysuria, hematuria. ENDOCRINE: Denies nocturia or thyroid problems. HEMATOLOGIC AND LYMPHATIC: Denies easy bruising or bleeding. SKIN: Denies rash or lesion. MUSCULOSKELETAL: Denies pain in neck, back, shoulder, knees, hips or arthritic symptoms.  NEUROLOGIC: Denies paralysis, paresthesias.  PSYCHIATRIC: Denies anxiety or depressive symptoms.   VITAL SIGNS:  Blood pressure (!) 177/68, pulse (!) 58, temperature 98.2 F (36.8 C), temperature source Oral, resp. rate 17, height 5\' 3"  (1.6 m),  weight 54.9 kg (121 lb), SpO2 96 %.  I/O:    Intake/Output Summary (Last 24 hours) at 02/15/2018 1450 Last data filed at 02/15/2018 0900 Gross per 24 hour  Intake 2235 ml  Output 1500 ml  Net 735 ml    PHYSICAL EXAMINATION:  GENERAL:  84 y.o.-year-old patient lying in the bed with no acute distress.  EYES: Pupils equal, round, reactive to light and accommodation. No scleral icterus. Extraocular muscles intact.  HEENT: Head atraumatic, normocephalic. Oropharynx and nasopharynx clear.  NECK:  Supple, no jugular venous distention. No thyroid enlargement, no tenderness.  LUNGS: Normal breath sounds bilaterally, no wheezing, rales,rhonchi or crepitation. No use of accessory muscles of respiration.  CARDIOVASCULAR: S1, S2 normal. No murmurs, rubs, or gallops.  ABDOMEN: Soft,  non-tender, non-distended. Bowel sounds present. No organomegaly or mass.  EXTREMITIES: No pedal edema, cyanosis, or clubbing.  NEUROLOGIC: Cranial nerves II through XII are intact. Muscle strength 5/5 in all extremities. Sensation intact. Gait not checked.  PSYCHIATRIC: The patient is alert and oriented x 3.  SKIN: No obvious rash, lesion, or ulcer.   DATA REVIEW:   CBC Recent Labs  Lab 02/14/18 0754  WBC 6.4  HGB 10.4*  HCT 31.0*  PLT 172    Chemistries  Recent Labs  Lab 02/14/18 0754  NA 137  K 3.9  CL 108  CO2 23  GLUCOSE 95  BUN 16  CREATININE 1.16*  CALCIUM 8.4*    Cardiac Enzymes Recent Labs  Lab 02/13/18 0840  TROPONINI <0.03    Microbiology Results  Results for orders placed or performed in visit on 01/14/18  CULTURE, URINE COMPREHENSIVE     Status: None   Collection Time: 01/14/18  9:14 AM  Result Value Ref Range Status   Urine Culture, Comprehensive Final report  Final   Organism ID, Bacteria Comment  Final    Comment: Mixed urogenital flora 6,000  Colonies/mL     RADIOLOGY:  Ct Head Wo Contrast  Result Date: 02/13/2018 CLINICAL DATA:  Per family pt with episode of shaking and not being able to be woken up by her husband. Pt family states that she has had episodes like this, but not to the severity of this one. Family denies any trauma. EXAM: CT HEAD WITHOUT CONTRAST TECHNIQUE: Contiguous axial images were obtained from the base of the skull through the vertex without intravenous contrast. COMPARISON:  Brain MRI, 02/04/2018 FINDINGS: Brain: No evidence of acute infarction, hemorrhage, hydrocephalus, extra-axial collection or mass lesion/mass effect. There is age appropriate ventricular and sulcal enlargement. Mild patchy white matter hypoattenuation is also noted consistent with chronic microvascular ischemic change. Vascular: No hyperdense vessel or unexpected calcification. Skull: Normal. Negative for fracture or focal lesion. Sinuses/Orbits:  Visualized globes and orbits are unremarkable. Visualized sinuses and mastoid air cells are clear. Other: None. IMPRESSION: 1. No acute intracranial abnormalities. 2. Age related volume loss and mild chronic microvascular ischemic change, stable compared to the recent prior brain MRI. Electronically Signed   By: Lajean Manes M.D.   On: 02/13/2018 07:47   Dg Chest Portable 1 View  Result Date: 02/13/2018 CLINICAL DATA:  Probable seizure.  Hypoxia. EXAM: PORTABLE CHEST 1 VIEW COMPARISON:  October 15, 2017 FINDINGS: The heart size and mediastinal contours are within normal limits. Both lungs are clear. The visualized skeletal structures are unremarkable. IMPRESSION: No active disease. Electronically Signed   By: Dorise Bullion III M.D   On: 02/13/2018 08:46    EKG:   Orders placed or performed  during the hospital encounter of 02/13/18  . ED EKG  . ED EKG  . EKG 12-Lead  . EKG 12-Lead  . EKG 12-Lead  . EKG 12-Lead  . EKG 12-Lead  . EKG 12-Lead  . EKG 12-Lead  . EKG 12-Lead      Management plans discussed with the patient, family and they are in agreement.  CODE STATUS:     Code Status Orders  (From admission, onward)        Start     Ordered   02/13/18 1259  Full code  Continuous     02/13/18 1258    Code Status History    This patient has a current code status but no historical code status.      TOTAL TIME TAKING CARE OF THIS PATIENT: 42 minutes.   Note: This dictation was prepared with Dragon dictation along with smaller phrase technology. Any transcriptional errors that result from this process are unintentional.   @[facility] @  on 02/15/2018 at 2:50 PM  Between 7am to 6pm - Pager - [phone]  After 6pm go to [email or web address] - password [email or web address] [facility]  Office  [phone]  CC: Primary care physician; Mar Daring, PA-C

## 2018-02-15 NOTE — Care Management (Signed)
No discharge needs identified by members of the care team 

## 2018-02-15 NOTE — Progress Notes (Signed)
Discharge teaching given to patient, son, and husband all verbalized understanding and had no questions. Patient IV removed. Patient will be transported home by family. All patient belongings gathered prior to leaving.

## 2018-02-16 ENCOUNTER — Telehealth: Payer: Self-pay

## 2018-02-16 NOTE — Telephone Encounter (Signed)
Transition Care Management Follow-Up Telephone Call   Date discharged and where: [facility] on 02/15/18.  How have you been since you were released from the hospital? Doing fine, no current s/s. Declines headaches since d/c.  Any patient concerns? None.  Items Reviewed:   Meds: verified  Allergies: verified  Dietary Changes Reviewed: N/A  Functional Questionnaire:  Independent-I Dependent-D  ADLs:   Dressing- I    Eating- I   Maintaining continence- I   Transferring- I   Transportation- Not currently due to new medications.   Meal Prep- I   Managing Meds- I  Confirmed importance and Date/Time of follow-up visits scheduled: 02/17/18 @ 11:20 AM   Confirmed with patient if condition worsens to call PCP or go to the Emergency Dept. Patient was given office number and encouraged to call back with questions or concerns: YES

## 2018-02-17 ENCOUNTER — Inpatient Hospital Stay: Payer: Self-pay | Admitting: Physician Assistant

## 2018-02-17 ENCOUNTER — Encounter: Payer: Self-pay | Admitting: Physician Assistant

## 2018-02-17 ENCOUNTER — Ambulatory Visit (INDEPENDENT_AMBULATORY_CARE_PROVIDER_SITE_OTHER): Payer: Medicare HMO | Admitting: Physician Assistant

## 2018-02-17 VITALS — BP 150/78 | HR 61 | Temp 97.8°F | Resp 16 | Ht 63.0 in | Wt 123.6 lb

## 2018-02-17 DIAGNOSIS — Z9189 Other specified personal risk factors, not elsewhere classified: Secondary | ICD-10-CM

## 2018-02-17 DIAGNOSIS — IMO0001 Reserved for inherently not codable concepts without codable children: Secondary | ICD-10-CM

## 2018-02-17 DIAGNOSIS — R569 Unspecified convulsions: Secondary | ICD-10-CM | POA: Diagnosis not present

## 2018-02-17 NOTE — Progress Notes (Signed)
[name] Female    DOB: [birth year]   84 y.o.   MRN: [id number] Visit Date: 02/17/2018  Today's Provider: Mar Daring, PA-C   Chief Complaint  Patient presents with  . Hospitalization Follow-up   Subjective:    HPI  Follow up Hospitalization  Patient was admitted to [facility] on 02/13/18 and discharged on 02/15/18. She was treated for Seizure. Treatment for this included started patient on Keppra 500mg  IV every 12 hourly. Neurology recommending to continue [place] outpatient. Unsure of cause and outpatient Neuro eval still pending. Telephone follow up was done on 02/16/18. She reports excellent compliance with treatment. She reports this condition is Improved.  Patient is scheduled to follow-up with [facility] on 04/12/18. -----------------------------------------------------------------------------------    Allergies  Allergen Reactions  . Clarithromycin   . Colesevelam Hcl     Burns stomach.  . Propoxyphene      Current Outpatient Medications:  .  apixaban (ELIQUIS) 2.5 MG TABS tablet, Take 1 tablet (2.5 mg total) by mouth 2 (two) times daily., Disp: 60 tablet, Rfl: 11 .  butalbital-acetaminophen-caffeine (FIORICET, ESGIC) 50-325-40 MG tablet, Take 1 tablet by mouth every 4 (four) hours as needed for headache or migraine., Disp: 14 tablet, Rfl: 0 .  levETIRAcetam (KEPPRA) 500 MG tablet, Take 1 tablet (500 mg total) by mouth 2 (two) times daily., Disp: 60 tablet, Rfl: 0 .  metoprolol tartrate (LOPRESSOR) 25 MG tablet, Take 1 tablet (25 mg total) by mouth 2 (two) times daily., Disp: 180 tablet, Rfl: 3 .  pantoprazole (PROTONIX) 40 MG tablet, TAKE 1 TABLET (40 MG TOTAL) BY MOUTH DAILY., Disp: 90 tablet, Rfl: 3 .  PARoxetine (PAXIL) 20 MG tablet, Take 1 tablet (20 mg total) by mouth daily., Disp: 90 tablet, Rfl: 3 .  rosuvastatin (CRESTOR) 20 MG tablet, Take 1 tablet (20 mg total) by mouth daily., Disp: 30 tablet, Rfl: 5 .  Vitamin D,  Ergocalciferol, (DRISDOL) 50000 units CAPS capsule, Take 1 capsule (50,000 Units total) by mouth every 7 (seven) days., Disp: 12 capsule, Rfl: 1 .  senna-docusate (SENOKOT-S) 8.6-50 MG tablet, Take 1 tablet by mouth at bedtime as needed for mild constipation. (Patient not taking: Reported on 02/16/2018), Disp: , Rfl:   Review of Systems  Constitutional: Negative.   Respiratory: Negative.   Cardiovascular: Negative.   Gastrointestinal: Negative.   Neurological: Positive for seizures and weakness. Negative for tremors and headaches.    Social History   Tobacco Use  . Smoking status: Never Smoker  . Smokeless tobacco: Never Used  Substance Use Topics  . Alcohol use: No   Objective:   BP (!) 150/78 (BP Location: Right Arm, Patient Position: Sitting, Cuff Size: Normal)   Pulse 61   Temp 97.8 F (36.6 C) (Oral)   Resp 16   Ht 5\' 3"  (1.6 m)   Wt 123 lb 9.6 oz (56.1 kg)   BMI 21.89 kg/m    Physical Exam  Constitutional: She is oriented to person, place, and time. She appears well-developed and well-nourished. No distress.  Neck: Normal range of motion. Neck supple. No JVD present. No tracheal deviation present. No thyromegaly present.  Cardiovascular: Normal rate, regular rhythm and normal heart sounds. Exam reveals no gallop and no friction rub.  No murmur heard. Pulmonary/Chest: Effort normal and breath sounds normal. No respiratory distress. She has no wheezes. She has no rales.  Lymphadenopathy:    She has no cervical adenopathy.  Neurological: She is alert and  oriented to person, place, and time. No cranial nerve deficit or sensory deficit. Coordination normal.  Skin: She is not diaphoretic.  Vitals reviewed.      Assessment & Plan:     1. Transition of care performed with sharing of clinical summary Notes of hospitalization from 02/13/18- 02/15/18 were reviewed. Transition phone note reviewed from 02/16/18. Patient has f/u with Neurology 04/12/18. She is to continue Keppra 500mg   nightly. Call if symptoms return or confusion recurs. No driving.   2. Seizure ([place]) See above medical treatment plan.       Mar Daring, PA-C  [facility]

## 2018-02-22 ENCOUNTER — Encounter: Payer: Self-pay | Admitting: Physician Assistant

## 2018-02-22 DIAGNOSIS — R569 Unspecified convulsions: Secondary | ICD-10-CM | POA: Insufficient documentation

## 2018-03-09 DIAGNOSIS — R410 Disorientation, unspecified: Secondary | ICD-10-CM | POA: Diagnosis not present

## 2018-03-09 DIAGNOSIS — G40909 Epilepsy, unspecified, not intractable, without status epilepticus: Secondary | ICD-10-CM | POA: Diagnosis not present

## 2018-03-09 DIAGNOSIS — I480 Paroxysmal atrial fibrillation: Secondary | ICD-10-CM | POA: Diagnosis not present

## 2018-03-09 DIAGNOSIS — G4489 Other headache syndrome: Secondary | ICD-10-CM | POA: Diagnosis not present

## 2018-03-09 DIAGNOSIS — R413 Other amnesia: Secondary | ICD-10-CM | POA: Diagnosis not present

## 2018-04-02 DIAGNOSIS — G40909 Epilepsy, unspecified, not intractable, without status epilepticus: Secondary | ICD-10-CM | POA: Diagnosis not present

## 2018-04-03 DIAGNOSIS — G40909 Epilepsy, unspecified, not intractable, without status epilepticus: Secondary | ICD-10-CM | POA: Diagnosis not present

## 2018-04-04 DIAGNOSIS — G40909 Epilepsy, unspecified, not intractable, without status epilepticus: Secondary | ICD-10-CM | POA: Diagnosis not present

## 2018-04-29 DIAGNOSIS — R69 Illness, unspecified: Secondary | ICD-10-CM | POA: Diagnosis not present

## 2018-05-19 ENCOUNTER — Other Ambulatory Visit: Payer: Self-pay | Admitting: Physician Assistant

## 2018-05-19 NOTE — Telephone Encounter (Signed)
[facility] in [place] is requesting a refill on the following medication  levETIRAcetam (KEPPRA) 500 MG tablet   She is out of this medication.

## 2018-05-20 ENCOUNTER — Telehealth: Payer: Self-pay

## 2018-05-20 MED ORDER — LEVETIRACETAM 500 MG PO TABS: 500.0000 mg | ORAL_TABLET | Freq: Two times a day (BID) | ORAL | 0 refills | Status: DC

## 2018-05-20 MED ORDER — LEVETIRACETAM 500 MG PO TABS: 500.0000 mg | ORAL_TABLET | Freq: Two times a day (BID) | ORAL | 5 refills | Status: DC

## 2018-05-20 NOTE — Telephone Encounter (Signed)
Please advise patient we sent refill for her seizure medicine to [facility], but just enough to get by until she sees Dr. Manuella Ghazi on 9-26. She needs to have Dr. Manuella Ghazi prescribe this in the future if she has to keep taking it.

## 2018-05-20 NOTE — Telephone Encounter (Signed)
[facility] Pharmacist calling to verify prescription.Pharmacist reports that they received regular Keppra but patient has been getting Keppra XR. Please Advise.  Thanks,  -Jahleah Mariscal

## 2018-05-20 NOTE — Telephone Encounter (Signed)
Please advise 

## 2018-05-20 NOTE — Addendum Note (Signed)
Addended by: Birdie Sons on: 05/20/2018 09:27 AM   Modules accepted: Orders

## 2018-05-21 NOTE — Telephone Encounter (Signed)
please have them clarify original rx. per our records, Dr. Illene Silver wrote rx for regular keppra 500mg  twice a day on 02/15/2018. why has she been getting keppra XR?  has she had rx written by someone else since then?

## 2018-05-21 NOTE — Telephone Encounter (Signed)
They need to send refill request to [facility].

## 2018-05-21 NOTE — Telephone Encounter (Signed)
I called and advised pharmacist as below. Pharmacist states they were able to get in contact with Amanda Robinson and she authorized refills for the patient.

## 2018-05-21 NOTE — Telephone Encounter (Signed)
[facility] pharmacist, regular Keppra was inactivated on 03/09/2018. A new rx was sent in for Keppra XR by Amanda Robinson. Patient has gotten Keppra XR filled 3 times since.

## 2018-06-10 DIAGNOSIS — R441 Visual hallucinations: Secondary | ICD-10-CM | POA: Diagnosis not present

## 2018-06-10 DIAGNOSIS — G4489 Other headache syndrome: Secondary | ICD-10-CM | POA: Diagnosis not present

## 2018-06-10 DIAGNOSIS — R413 Other amnesia: Secondary | ICD-10-CM | POA: Diagnosis not present

## 2018-06-10 DIAGNOSIS — I480 Paroxysmal atrial fibrillation: Secondary | ICD-10-CM | POA: Diagnosis not present

## 2018-06-10 DIAGNOSIS — R410 Disorientation, unspecified: Secondary | ICD-10-CM | POA: Diagnosis not present

## 2018-06-10 DIAGNOSIS — G40909 Epilepsy, unspecified, not intractable, without status epilepticus: Secondary | ICD-10-CM | POA: Diagnosis not present

## 2018-07-02 ENCOUNTER — Ambulatory Visit (INDEPENDENT_AMBULATORY_CARE_PROVIDER_SITE_OTHER): Payer: Medicare HMO

## 2018-07-02 DIAGNOSIS — Z23 Encounter for immunization: Secondary | ICD-10-CM | POA: Diagnosis not present

## 2018-07-11 ENCOUNTER — Other Ambulatory Visit: Payer: Self-pay | Admitting: Family Medicine

## 2018-07-11 DIAGNOSIS — I679 Cerebrovascular disease, unspecified: Secondary | ICD-10-CM

## 2018-08-04 ENCOUNTER — Ambulatory Visit
Admission: RE | Admit: 2018-08-04 | Discharge: 2018-08-04 | Disposition: A | Discharge status: Home / Self Care | Payer: Medicare HMO | Source: Ambulatory Visit | Attending: Physician Assistant | Admitting: Physician Assistant

## 2018-08-04 ENCOUNTER — Ambulatory Visit (INDEPENDENT_AMBULATORY_CARE_PROVIDER_SITE_OTHER): Payer: Medicare HMO | Admitting: Physician Assistant

## 2018-08-04 ENCOUNTER — Encounter: Payer: Self-pay | Admitting: Physician Assistant

## 2018-08-04 VITALS — BP 152/68 | HR 56 | Temp 98.3°F | Resp 16 | Wt 126.2 lb

## 2018-08-04 DIAGNOSIS — M79671 Pain in right foot: Secondary | ICD-10-CM | POA: Diagnosis not present

## 2018-08-04 DIAGNOSIS — E7800 Pure hypercholesterolemia, unspecified: Secondary | ICD-10-CM

## 2018-08-04 DIAGNOSIS — M19071 Primary osteoarthritis, right ankle and foot: Secondary | ICD-10-CM | POA: Insufficient documentation

## 2018-08-04 DIAGNOSIS — M7731 Calcaneal spur, right foot: Secondary | ICD-10-CM | POA: Insufficient documentation

## 2018-08-04 MED ORDER — METHYLPREDNISOLONE 4 MG PO TBPK: ORAL_TABLET | ORAL | 0 refills | Status: DC

## 2018-08-04 MED ORDER — SIMVASTATIN 20 MG PO TABS: 20.0000 mg | ORAL_TABLET | Freq: Every day | ORAL | 3 refills | Status: DC

## 2018-08-04 NOTE — Progress Notes (Signed)
[name] Female    DOB: 1922-01-19   84 y.o.   MRN: [id number] Visit Date: 08/04/2018  Today's Provider: Mar Daring, PA-C   Chief Complaint  Patient presents with  . Foot Pain   Subjective:    Foot Injury   The incident occurred more than 1 week ago (3-4 months). There was no injury mechanism. The pain is present in the right foot and right heel. The quality of the pain is described as aching and burning. The pain is moderate. The pain has been constant since onset. Pertinent negatives include no numbness or tingling. She reports no foreign bodies present. Exacerbated by: the more she walks the more it hurts. She has tried immobilization and heat for the symptoms. The treatment provided no relief.   She believes this pain is coming from Rosuvastatin  Calcium tablets and reports that she went back on the simvastatin about a month ago but went back on rosuvastatin and symptoms have worsen. She reports that her foot started getting worse within 2 weeks of taking the rosuvastatin. She would like to go back on simvastatin.      Allergies  Allergen Reactions  . Clarithromycin   . Colesevelam Hcl     Burns stomach.  . Propoxyphene      Current Outpatient Medications:  .  apixaban (ELIQUIS) 2.5 MG TABS tablet, Take 1 tablet (2.5 mg total) by mouth 2 (two) times daily., Disp: 60 tablet, Rfl: 11 .  butalbital-acetaminophen-caffeine (FIORICET, ESGIC) 50-325-40 MG tablet, Take 1 tablet by mouth every 4 (four) hours as needed for headache or migraine., Disp: 14 tablet, Rfl: 0 .  levETIRAcetam (KEPPRA) 500 MG tablet, Take 1 tablet (500 mg total) by mouth 2 (two) times daily., Disp: 42 tablet, Rfl: 0 .  metoprolol tartrate (LOPRESSOR) 25 MG tablet, Take 1 tablet (25 mg total) by mouth 2 (two) times daily., Disp: 180 tablet, Rfl: 3 .  pantoprazole (PROTONIX) 40 MG tablet, TAKE 1 TABLET (40 MG TOTAL) BY MOUTH DAILY., Disp: 90 tablet, Rfl: 3 .  PARoxetine (PAXIL) 20  MG tablet, Take 1 tablet (20 mg total) by mouth daily., Disp: 90 tablet, Rfl: 3 .  rosuvastatin (CRESTOR) 20 MG tablet, TAKE 1 TABLET BY MOUTH EVERY DAY, Disp: 90 tablet, Rfl: 2 .  Vitamin D, Ergocalciferol, (DRISDOL) 50000 units CAPS capsule, Take 1 capsule (50,000 Units total) by mouth every 7 (seven) days., Disp: 12 capsule, Rfl: 1 .  senna-docusate (SENOKOT-S) 8.6-50 MG tablet, Take 1 tablet by mouth at bedtime as needed for mild constipation. (Patient not taking: Reported on 02/16/2018), Disp: , Rfl:   Review of Systems  Constitutional: Negative.   Respiratory: Negative.   Cardiovascular: Negative.   Gastrointestinal: Negative.   Musculoskeletal: Positive for arthralgias and gait problem. Negative for joint swelling.  Neurological: Negative for tingling and numbness.    Social History   Tobacco Use  . Smoking status: Never Smoker  . Smokeless tobacco: Never Used  Substance Use Topics  . Alcohol use: No   Objective:   BP (!) 152/68 (BP Location: Left Arm, Patient Position: Sitting, Cuff Size: Normal)   Pulse (!) 56   Temp 98.3 F (36.8 C) (Oral)   Resp 16   Wt 126 lb 3.2 oz (57.2 kg)   BMI 22.36 kg/m  Vitals:   08/04/18 1032  BP: (!) 152/68  Pulse: (!) 56  Resp: 16  Temp: 98.3 F (36.8 C)  TempSrc: Oral  Weight: 126  lb 3.2 oz (57.2 kg)     Physical Exam  Constitutional: She appears well-developed and well-nourished.  HENT:  Head: Normocephalic and atraumatic.  Eyes: EOM are normal.  Neck: Normal range of motion. Neck supple.  Pulmonary/Chest: Effort normal. No respiratory distress.  Musculoskeletal:       Feet:  Psychiatric: She has a normal mood and affect. Her behavior is normal. Judgment and thought content normal.  Vitals reviewed.       Assessment & Plan:     1. Right foot pain Will get imaging as below. Suspect heel spur vs plantar fasciitis or both. Will give medrol dose pak as below since patient is on eliquis and unable to take other NSAIDs. I  will f/u pending results.  - DG Foot Complete Right; Future - methylPREDNISolone (MEDROL) 4 MG TBPK tablet; 6 day taper; take as directed on package instructions  Dispense: 21 tablet; Refill: 0  2. Pure hypercholesterolemia Change back to simvastatin from rosuvastatin as below per patient request.  - simvastatin (ZOCOR) 20 MG tablet; Take 1 tablet (20 mg total) by mouth at bedtime.  Dispense: 90 tablet; Refill: [place], PA-C  [facility]

## 2018-08-04 NOTE — Patient Instructions (Signed)
Heel Spur A heel spur is a bony growth that forms on the bottom of your heel bone (calcaneus). Heel spurs are common and do not always cause pain. However, heel spurs often cause inflammation in the strong band of tissue that runs underneath the bone of your foot (plantar fascia). When this happens, you may feel pain on the bottom of your foot, near your heel. What are the causes? The cause of heel spurs is not completely understood. They may be caused by pressure on the heel. Or, they may stem from the muscle attachments (tendons) near the spur pulling on the heel. What increases the risk? You may be at risk for a heel spur if you:  Are older than 40.  Are overweight.  Have wear and tear arthritis (osteoarthritis).  Have plantar fascia inflammation.  What are the signs or symptoms? Some people have heel spurs but no symptoms. If you do have symptoms, they may include:  Pain in the bottom of your heel.  Pain that is worse when you first get out of bed.  Pain that gets worse after walking or standing.  How is this diagnosed? Your health care provider may diagnose a heel spur based on your symptoms and a physical exam. You may also have an X-ray of your foot to check for a bony growth coming from the calcaneus. How is this treated? Treatment aims to relieve the pain from the heel spur. This may include:  Stretching exercises.  Losing weight.  Wearing specific shoes, inserts, or orthotics for comfort and support.  Wearing splints at night to properly position your feet.  Taking over-the-counter medicine to relieve pain.  Being treated with high-intensity sound waves to break up the heel spur (extracorporeal shock wave therapy).  Getting steroid injections in your heel to reduce swelling and ease pain.  Having surgery if your heel spur causes long-term (chronic) pain.  Follow these instructions at home:  Take medicines only as directed by your health care provider.  Ask  your health care provider if you should use ice or cold packs on the painful areas of your heel or foot.  Avoid activities that cause you pain until you recover or as directed by your health care provider.  Stretch before exercising or being physically active.  Wear supportive shoes that fit well as directed by your health care provider. You might need to buy new shoes. Wearing old shoes or shoes that do not fit correctly may not provide the support that you need.  Lose weight if your health care provider thinks you should. This can relieve pressure on your foot that may be causing pain and discomfort. Contact a health care provider if:  Your pain continues or gets worse. This information is not intended to replace advice given to you by your health care provider. Make sure you discuss any questions you have with your health care provider. Document Released: 10/08/2005 Document Revised: 02/07/2016 Document Reviewed: 11/02/2013 Elsevier Interactive Patient Education  [employer].

## 2018-08-06 DIAGNOSIS — D485 Neoplasm of uncertain behavior of skin: Secondary | ICD-10-CM | POA: Diagnosis not present

## 2018-08-06 DIAGNOSIS — L821 Other seborrheic keratosis: Secondary | ICD-10-CM | POA: Diagnosis not present

## 2018-08-06 DIAGNOSIS — Z85828 Personal history of other malignant neoplasm of skin: Secondary | ICD-10-CM | POA: Diagnosis not present

## 2018-08-06 DIAGNOSIS — L570 Actinic keratosis: Secondary | ICD-10-CM | POA: Diagnosis not present

## 2018-08-16 ENCOUNTER — Telehealth: Payer: Self-pay

## 2018-08-16 NOTE — Telephone Encounter (Signed)
08/16/2018 scheduled AWV appt. For 08/27/18 11am

## 2018-08-27 ENCOUNTER — Ambulatory Visit (INDEPENDENT_AMBULATORY_CARE_PROVIDER_SITE_OTHER): Payer: Medicare HMO

## 2018-08-27 VITALS — BP 126/54 | HR 56 | Temp 97.9°F | Ht 63.0 in | Wt 126.8 lb

## 2018-08-27 DIAGNOSIS — Z0000 Encounter for general adult medical examination without abnormal findings: Secondary | ICD-10-CM | POA: Diagnosis not present

## 2018-08-27 NOTE — Patient Instructions (Addendum)
Amanda Robinson , Thank you for taking time to come for your Medicare Wellness Visit. I appreciate your ongoing commitment to your health goals. Please review the following plan we discussed and let me know if I can assist you in the future.   Screening recommendations/referrals: Colonoscopy: No longer required.  Mammogram: No longer required.  Bone Density: Pt declines order today.  Recommended yearly ophthalmology/optometry visit for glaucoma screening and checkup Recommended yearly dental visit for hygiene and checkup  Vaccinations: Influenza vaccine: Up to date Pneumococcal vaccine: Completed series Tdap vaccine: Up to date, due 06/2021 Shingles vaccine: Pt declines today.     Advanced directives: Please bring a copy of your POA (Power of Attorney) and/or Living Will to your next appointment.   Conditions/risks identified: Recommend to remove any items from the home that may cause slips or trips.   Next appointment: 08/30/18 with Fenton Malling.    Preventive Care 82 Years and Older, Female Preventive care refers to lifestyle choices and visits with your health care provider that can promote health and wellness. What does preventive care include?  A yearly physical exam. This is also called an annual well check.  Dental exams once or twice a year.  Routine eye exams. Ask your health care provider how often you should have your eyes checked.  Personal lifestyle choices, including:  Daily care of your teeth and gums.  Regular physical activity.  Eating a healthy diet.  Avoiding tobacco and drug use.  Limiting alcohol use.  Practicing safe sex.  Taking low-dose aspirin every day.  Taking vitamin and mineral supplements as recommended by your health care provider. What happens during an annual well check? The services and screenings done by your health care provider during your annual well check will depend on your age, overall health, lifestyle risk factors, and family  history of disease. Counseling  Your health care provider may ask you questions about your:  Alcohol use.  Tobacco use.  Drug use.  Emotional well-being.  Home and relationship well-being.  Sexual activity.  Eating habits.  History of falls.  Memory and ability to understand (cognition).  Work and work Statistician.  Reproductive health. Screening  You may have the following tests or measurements:  Height, weight, and BMI.  Blood pressure.  Lipid and cholesterol levels. These may be checked every 5 years, or more frequently if you are over 75 years old.  Skin check.  Lung cancer screening. You may have this screening every year starting at age 66 if you have a 30-pack-year history of smoking and currently smoke or have quit within the past 15 years.  Fecal occult blood test (FOBT) of the stool. You may have this test every year starting at age 77.  Flexible sigmoidoscopy or colonoscopy. You may have a sigmoidoscopy every 5 years or a colonoscopy every 10 years starting at age 47.  Hepatitis C blood test.  Hepatitis B blood test.  Sexually transmitted disease (STD) testing.  Diabetes screening. This is done by checking your blood sugar (glucose) after you have not eaten for a while (fasting). You may have this done every 1-3 years.  Bone density scan. This is done to screen for osteoporosis. You may have this done starting at age 23.  Mammogram. This may be done every 1-2 years. Talk to your health care provider about how often you should have regular mammograms. Talk with your health care provider about your test results, treatment options, and if necessary, the need for more tests.  Vaccines  Your health care provider may recommend certain vaccines, such as:  Influenza vaccine. This is recommended every year.  Tetanus, diphtheria, and acellular pertussis (Tdap, Td) vaccine. You may need a Td booster every 10 years.  Zoster vaccine. You may need this after  age 23.  Pneumococcal 13-valent conjugate (PCV13) vaccine. One dose is recommended after age 39.  Pneumococcal polysaccharide (PPSV23) vaccine. One dose is recommended after age 33. Talk to your health care provider about which screenings and vaccines you need and how often you need them. This information is not intended to replace advice given to you by your health care provider. Make sure you discuss any questions you have with your health care provider. Document Released: 09/28/2015 Document Revised: 05/21/2016 Document Reviewed: 07/03/2015 Elsevier Interactive Patient Education  2017 [place] Prevention in the Home Falls can cause injuries. They can happen to people of all ages. There are many things you can do to make your home safe and to help prevent falls. What can I do on the outside of my home?  Regularly fix the edges of walkways and driveways and fix any cracks.  Remove anything that might make you trip as you walk through a door, such as a raised step or threshold.  Trim any bushes or trees on the path to your home.  Use bright outdoor lighting.  Clear any walking paths of anything that might make someone trip, such as rocks or tools.  Regularly check to see if handrails are loose or broken. Make sure that both sides of any steps have handrails.  Any raised decks and porches should have guardrails on the edges.  Have any leaves, snow, or ice cleared regularly.  Use sand or salt on walking paths during winter.  Clean up any spills in your garage right away. This includes oil or grease spills. What can I do in the bathroom?  Use night lights.  Install grab bars by the toilet and in the tub and shower. Do not use towel bars as grab bars.  Use non-skid mats or decals in the tub or shower.  If you need to sit down in the shower, use a plastic, non-slip stool.  Keep the floor dry. Clean up any water that spills on the floor as soon as it  happens.  Remove soap buildup in the tub or shower regularly.  Attach bath mats securely with double-sided non-slip rug tape.  Do not have throw rugs and other things on the floor that can make you trip. What can I do in the bedroom?  Use night lights.  Make sure that you have a light by your bed that is easy to reach.  Do not use any sheets or blankets that are too big for your bed. They should not hang down onto the floor.  Have a firm chair that has side arms. You can use this for support while you get dressed.  Do not have throw rugs and other things on the floor that can make you trip. What can I do in the kitchen?  Clean up any spills right away.  Avoid walking on wet floors.  Keep items that you use a lot in easy-to-reach places.  If you need to reach something above you, use a strong step stool that has a grab bar.  Keep electrical cords out of the way.  Do not use floor polish or wax that makes floors slippery. If you must use wax, use non-skid floor wax.  Do not have throw rugs and other things on the floor that can make you trip. What can I do with my stairs?  Do not leave any items on the stairs.  Make sure that there are handrails on both sides of the stairs and use them. Fix handrails that are broken or loose. Make sure that handrails are as long as the stairways.  Check any carpeting to make sure that it is firmly attached to the stairs. Fix any carpet that is loose or worn.  Avoid having throw rugs at the top or bottom of the stairs. If you do have throw rugs, attach them to the floor with carpet tape.  Make sure that you have a light switch at the top of the stairs and the bottom of the stairs. If you do not have them, ask someone to add them for you. What else can I do to help prevent falls?  Wear shoes that:  Do not have high heels.  Have rubber bottoms.  Are comfortable and fit you well.  Are closed at the toe. Do not wear sandals.  If you  use a stepladder:  Make sure that it is fully opened. Do not climb a closed stepladder.  Make sure that both sides of the stepladder are locked into place.  Ask someone to hold it for you, if possible.  Clearly mark and make sure that you can see:  Any grab bars or handrails.  First and last steps.  Where the edge of each step is.  Use tools that help you move around (mobility aids) if they are needed. These include:  Canes.  Walkers.  Scooters.  Crutches.  Turn on the lights when you go into a dark area. Replace any light bulbs as soon as they burn out.  Set up your furniture so you have a clear path. Avoid moving your furniture around.  If any of your floors are uneven, fix them.  If there are any pets around you, be aware of where they are.  Review your medicines with your doctor. Some medicines can make you feel dizzy. This can increase your chance of falling. Ask your doctor what other things that you can do to help prevent falls. This information is not intended to replace advice given to you by your health care provider. Make sure you discuss any questions you have with your health care provider. Document Released: 06/28/2009 Document Revised: 02/07/2016 Document Reviewed: 10/06/2014 Elsevier Interactive Patient Education  2017 [employer].

## 2018-08-27 NOTE — Progress Notes (Signed)
Subjective:   Amanda Robinson is a 84 y.o. female who presents for Medicare Annual (Subsequent) preventive examination.  Review of Systems:  N/A  Cardiac Risk Factors include: advanced age (>35men, >27 women);dyslipidemia     Objective:     Vitals: BP (!) 126/54 (BP Location: Right Arm)   Pulse (!) 56   Temp 97.9 F (36.6 C) (Oral)   Ht 5\' 3"  (1.6 m)   Wt 126 lb 12.8 oz (57.5 kg)   BMI 22.46 kg/m   Body mass index is 22.46 kg/m.  Advanced Directives 08/27/2018 02/13/2018 10/15/2017 06/23/2017 04/01/2017 06/18/2016 06/04/2015  Does Patient Have a Medical Advance Directive? Yes No Yes Yes Yes Yes Yes  Type of Paramedic of [place];Living will - [place];Living will [place];Living will [place];Living will [place];Living will Living will;Healthcare Power of [place] in Chart? - - - - - - No - copy requested  Would patient like information on creating a medical advance directive? - No - Patient declined - - - - -    Tobacco Social History   Tobacco Use  Smoking Status Never Smoker  Smokeless Tobacco Never Used     Counseling given: Not Answered   Clinical Intake:  Pre-visit preparation completed: Yes  Pain : 0-10 Pain Score: 5  Pain Location: Heel Pain Orientation: Right Pain Descriptors / Indicators: Throbbing Pain Frequency: Intermittent     Nutritional Status: BMI of 19-24  Normal Nutritional Risks: None Diabetes: No  How often do you need to have someone help you when you read instructions, pamphlets, or other written materials from your doctor or pharmacy?: 1 - Never  Interpreter Needed?: No  Information entered by :: [facility], LPN  Past Medical History:  Diagnosis Date  . Afib ([place])   . CAD (coronary artery disease)   . Chronic kidney disease   . Skin cancer    Past Surgical History:  Procedure Laterality  Date  . ABDOMINAL HYSTERECTOMY  1963  . BREAST SURGERY Bilateral 1985   Biopsy  . CHOLECYSTECTOMY  03/1998  . 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 PARTIAL COLECTOMY  1963   Family History  Problem Relation Age of Onset  . Esophageal cancer Mother   . Alzheimer's disease Father   . Parkinsonism Brother   . Brain cancer Sister   . Alcohol abuse Brother   . Ovarian cancer Other   . Colon cancer Other    Social History   Socioeconomic History  . Marital status: Married    Spouse name: Not on file  . Number of children: 1  . Years of education: Not on file  . Highest education level: High school graduate  Occupational History  . Occupation: retired  Scientific laboratory technician  . Financial resource strain: Not hard at all  . Food insecurity:    Worry: Never true    Inability: Never true  . Transportation needs:    Medical: No    Non-medical: No  Tobacco Use  . Smoking status: Never Smoker  . Smokeless tobacco: Never Used  Substance and Sexual Activity  . Alcohol use: No  . Drug use: No  . Sexual activity: Not Currently  Lifestyle  . Physical activity:    Days per week: 0 days    Minutes per session: 0 min  . Stress: Only a little  Relationships  . Social  connections:    Talks on phone: Patient refused    Gets together: Patient refused    Attends religious service: Patient refused    Active member of club or organization: Patient refused    Attends meetings of clubs or organizations: Patient refused    Relationship status: Patient refused  Other Topics Concern  . Not on file  Social History Narrative  . Not on file    Outpatient Encounter Medications as of 08/27/2018  Medication Sig  . apixaban (ELIQUIS) 2.5 MG TABS tablet Take 1 tablet (2.5 mg total) by mouth 2 (two) times daily.  . butalbital-acetaminophen-caffeine (FIORICET, ESGIC) 50-325-40 MG tablet Take 1 tablet by mouth every 4 (four) hours as needed for headache or migraine.  . levETIRAcetam  (KEPPRA) 500 MG tablet Take 1 tablet (500 mg total) by mouth 2 (two) times daily.  . metoprolol tartrate (LOPRESSOR) 25 MG tablet Take 1 tablet (25 mg total) by mouth 2 (two) times daily.  . pantoprazole (PROTONIX) 40 MG tablet TAKE 1 TABLET (40 MG TOTAL) BY MOUTH DAILY.  Marland Kitchen PARoxetine (PAXIL) 20 MG tablet Take 1 tablet (20 mg total) by mouth daily.  . simvastatin (ZOCOR) 20 MG tablet Take 1 tablet (20 mg total) by mouth at bedtime.  . Vitamin D, Ergocalciferol, (DRISDOL) 50000 units CAPS capsule Take 1 capsule (50,000 Units total) by mouth every 7 (seven) days.  . methylPREDNISolone (MEDROL) 4 MG TBPK tablet 6 day taper; take as directed on package instructions (Patient not taking: Reported on 08/27/2018)  . senna-docusate (SENOKOT-S) 8.6-50 MG tablet Take 1 tablet by mouth at bedtime as needed for mild constipation. (Patient not taking: Reported on 02/16/2018)   No facility-administered encounter medications on file as of 08/27/2018.     Activities of Daily Living In your present state of health, do you have any difficulty performing the following activities: 08/27/2018 02/13/2018  Hearing? Tempie Donning  Comment Wears hearing aids. -  Vision? Y N  Comment With close up print.  -  Difficulty concentrating or making decisions? Tempie Donning  Walking or climbing stairs? N N  Dressing or bathing? N N  Doing errands, shopping? N N  Preparing Food and eating ? N -  Using the Toilet? N -  In the past six months, have you accidently leaked urine? N -  Do you have problems with loss of bowel control? N -  Managing your Medications? N -  Managing your Finances? N -  Housekeeping or managing your Housekeeping? N -  Some recent data might be hidden    Patient Care Team: Mar Daring, PA-C as PCP - General (Family Medicine) Birdie Sons, MD as Referring Physician (Family Medicine)    Assessment:   This is a routine wellness examination for Amanda Robinson.  Exercise Activities and Dietary  recommendations Current Exercise Habits: The patient does not participate in regular exercise at present, Exercise limited by: None identified  Goals    . Exercise 150 minutes per week (moderate activity)    . Prevent falls     Recommend to remove any items from the home that may cause slips or trips.         Fall Risk Fall Risk  08/27/2018 06/23/2017 06/18/2016 06/04/2015 04/17/2015  Falls in the past year? 1 No No No No  Number falls in past yr: 1 - - - -  Injury with Fall? 0 - - - -  Follow up Falls prevention discussed - - - -   FALL RISK  PREVENTION PERTAINING TO THE HOME:  Any stairs in or around the home WITH handrails? No  Home free of loose throw rugs in walkways, pet beds, electrical cords, etc? Yes  Adequate lighting in your home to reduce risk of falls? Yes   ASSISTIVE DEVICES UTILIZED TO PREVENT FALLS:  Life alert? No  Use of a cane, walker or w/c? No  Grab bars in the bathroom? Yes  Shower chair or bench in shower? Yes  Elevated toilet seat or a handicapped toilet? No    TIMED UP AND GO:  Was the test performed? No .     Depression Screen PHQ 2/9 Scores 08/27/2018 08/27/2018 06/23/2017 06/18/2016  PHQ - 2 Score 1 1 1  0  PHQ- 9 Score 3 - 3 -  Exception Documentation - - - -     Cognitive Function     6CIT Screen 08/27/2018  What Year? 0 points  What month? 0 points  What time? 0 points  Count back from 20 0 points  Months in reverse 0 points  Repeat phrase 4 points  Total Score 4    Immunization History  Administered Date(s) Administered  . Influenza Split 06/23/2011, 06/25/2012  . Influenza, High Dose Seasonal PF 07/08/2014, 06/04/2015, 06/18/2016, 06/23/2017, 07/02/2018  . Influenza,inj,Quad PF,6+ Mos 07/11/2013  . Pneumococcal Conjugate-13 01/01/2015  . Pneumococcal Polysaccharide-23 07/09/2006  . Td 07/09/2006  . Tdap 06/23/2011    Qualifies for Shingles Vaccine? Yes . Due for Shingrix. Education has been provided regarding the  importance of this vaccine. Pt has been advised to call insurance company to determine out of pocket expense. Advised may also receive vaccine at local pharmacy or Health Dept. Verbalized acceptance and understanding.  Tdap: Up to date  Flu Vaccine: Up to date  Pneumococcal Vaccine: Up to date   Screening Tests Health Maintenance  Topic Date Due  . DEXA SCAN  07/19/1989  . TETANUS/TDAP  06/22/2021  . INFLUENZA VACCINE  Completed  . PNA vac Low Risk Adult  Completed    Cancer Screenings:  Colorectal Screening: No longer required.   Mammogram: No longer required.   Bone Density: Completed 2008. Results reflect NORMAL per pt. Pt declined referral today.   Lung Cancer Screening: (Low Dose CT Chest recommended if Age 62-80 years, 30 pack-year currently smoking OR have quit w/in 15years.) does not qualify.    Additional Screening:  Vision Screening: Recommended annual ophthalmology exams for early detection of glaucoma and other disorders of the eye.  Dental Screening: Recommended annual dental exams for proper oral hygiene  Community Resource Referral:  CRR required this visit?  No       Plan:  I have personally reviewed and addressed the Medicare Annual Wellness questionnaire and have noted the following in the patient's chart:  A. Medical and social history B. Use of alcohol, tobacco or illicit drugs  C. Current medications and supplements D. Functional ability and status E.  Nutritional status F.  Physical activity G. Advance directives H. List of other physicians I.  Hospitalizations, surgeries, and ER visits in previous 12 months J.  Eugenio Saenz such as hearing and vision if needed, cognitive and depression L. Referrals and appointments - none  In addition, I have reviewed and discussed with patient certain preventive protocols, quality metrics, and best practice recommendations. A written personalized care plan for preventive services as well as  general preventive health recommendations were provided to patient.  See attached scanned questionnaire for additional information.   Signed,  Fabio Neighbors, LPN Nurse Health Advisor   Nurse Recommendations: Pt declined the DEXA scan referral today.

## 2018-08-30 ENCOUNTER — Encounter: Payer: Self-pay | Admitting: Physician Assistant

## 2018-08-30 ENCOUNTER — Ambulatory Visit (INDEPENDENT_AMBULATORY_CARE_PROVIDER_SITE_OTHER): Payer: Medicare HMO | Admitting: Physician Assistant

## 2018-08-30 VITALS — BP 125/70 | HR 57 | Temp 98.0°F | Wt 127.0 lb

## 2018-08-30 DIAGNOSIS — M79671 Pain in right foot: Secondary | ICD-10-CM | POA: Diagnosis not present

## 2018-08-30 NOTE — Progress Notes (Signed)
[name] Female    DOB: May 02, 1922   84 y.o.   MRN: [id number] Visit Date: 08/30/2018  Today's Provider: Mar Daring, PA-C   Chief Complaint  Patient presents with  . Follow-up   Subjective:    HPI  Foot Pain Patient presents today for follow-up on right foot and heel pain. Patient was given medrol dose pak due to patient been on Eliquis and able to take other NSAIDS. Patient states that the medication did not help and she would like a referral to a podiatrist. Still having pain in the anterior heel and arch of the right foot, worse after standing for long periods of time.     Allergies  Allergen Reactions  . Clarithromycin   . Colesevelam Hcl     Burns stomach.  . Propoxyphene      Current Outpatient Medications:  .  apixaban (ELIQUIS) 2.5 MG TABS tablet, Take 1 tablet (2.5 mg total) by mouth 2 (two) times daily., Disp: 60 tablet, Rfl: 11 .  butalbital-acetaminophen-caffeine (FIORICET, ESGIC) 50-325-40 MG tablet, Take 1 tablet by mouth every 4 (four) hours as needed for headache or migraine., Disp: 14 tablet, Rfl: 0 .  levETIRAcetam (KEPPRA) 500 MG tablet, Take 1 tablet (500 mg total) by mouth 2 (two) times daily., Disp: 42 tablet, Rfl: 0 .  methylPREDNISolone (MEDROL) 4 MG TBPK tablet, 6 day taper; take as directed on package instructions, Disp: 21 tablet, Rfl: 0 .  metoprolol tartrate (LOPRESSOR) 25 MG tablet, Take 1 tablet (25 mg total) by mouth 2 (two) times daily., Disp: 180 tablet, Rfl: 3 .  pantoprazole (PROTONIX) 40 MG tablet, TAKE 1 TABLET (40 MG TOTAL) BY MOUTH DAILY., Disp: 90 tablet, Rfl: 3 .  PARoxetine (PAXIL) 20 MG tablet, Take 1 tablet (20 mg total) by mouth daily., Disp: 90 tablet, Rfl: 3 .  simvastatin (ZOCOR) 20 MG tablet, Take 1 tablet (20 mg total) by mouth at bedtime., Disp: 90 tablet, Rfl: 3 .  Vitamin D, Ergocalciferol, (DRISDOL) 50000 units CAPS capsule, Take 1 capsule (50,000 Units total) by mouth every 7 (seven) days.,  Disp: 12 capsule, Rfl: 1 .  senna-docusate (SENOKOT-S) 8.6-50 MG tablet, Take 1 tablet by mouth at bedtime as needed for mild constipation. (Patient not taking: Reported on 02/16/2018), Disp: , Rfl:   Review of Systems  Constitutional: Negative.   HENT: Negative.   Respiratory: Negative.   Gastrointestinal: Negative.   Musculoskeletal: Positive for arthralgias.  Neurological: Negative.     Social History   Tobacco Use  . Smoking status: Never Smoker  . Smokeless tobacco: Never Used  Substance Use Topics  . Alcohol use: No      Objective:   BP 125/70 (BP Location: Right Arm, Patient Position: Sitting, Cuff Size: Normal)   Pulse (!) 57   Temp 98 F (36.7 C) (Oral)   Wt 127 lb (57.6 kg)   SpO2 98%   BMI 22.50 kg/m  Vitals:   08/30/18 0950  BP: 125/70  Pulse: (!) 57  Temp: 98 F (36.7 C)  TempSrc: Oral  SpO2: 98%  Weight: 127 lb (57.6 kg)     Physical Exam Vitals signs reviewed.  Constitutional:      General: She is not in acute distress.    Appearance: She is well-developed. She is not diaphoretic.  Neck:     Musculoskeletal: Normal range of motion and neck supple.  Cardiovascular:     Rate and Rhythm: Normal rate and regular  rhythm.     Pulses:          Dorsalis pedis pulses are 2+ on the right side and 2+ on the left side.       Posterior tibial pulses are 2+ on the right side and 2+ on the left side.     Heart sounds: Normal heart sounds. No murmur. No friction rub. No gallop.   Pulmonary:     Effort: Pulmonary effort is normal. No respiratory distress.     Breath sounds: Normal breath sounds. No wheezing or rales.  Musculoskeletal:     Right foot: Normal range of motion. No deformity.     Left foot: Normal range of motion. No deformity.       Feet:  Feet:     Right foot:     Skin integrity: Skin integrity normal.     Left foot:     Skin integrity: Skin integrity normal.         Assessment & Plan    1. Intractable right heel pain Failed  medrol dose pak. Cannot take other NSAIDs due to being on eliquis and would be high GI bleed risk. Referral placed to podiatry as below for consideration of steroid injection in heel.  - Ambulatory referral to [place], PA-C  [facility]

## 2018-08-30 NOTE — Patient Instructions (Signed)
Heel Spur A heel spur is a bony growth that forms on the bottom of your heel bone (calcaneus). Heel spurs are common and do not always cause pain. However, heel spurs often cause inflammation in the strong band of tissue that runs underneath the bone of your foot (plantar fascia). When this happens, you may feel pain on the bottom of your foot, near your heel. What are the causes? The cause of heel spurs is not completely understood. They may be caused by pressure on the heel. Or, they may stem from the muscle attachments (tendons) near the spur pulling on the heel. What increases the risk? You may be at risk for a heel spur if you:  Are older than 40.  Are overweight.  Have wear and tear arthritis (osteoarthritis).  Have plantar fascia inflammation.  What are the signs or symptoms? Some people have heel spurs but no symptoms. If you do have symptoms, they may include:  Pain in the bottom of your heel.  Pain that is worse when you first get out of bed.  Pain that gets worse after walking or standing.  How is this diagnosed? Your health care provider may diagnose a heel spur based on your symptoms and a physical exam. You may also have an X-ray of your foot to check for a bony growth coming from the calcaneus. How is this treated? Treatment aims to relieve the pain from the heel spur. This may include:  Stretching exercises.  Losing weight.  Wearing specific shoes, inserts, or orthotics for comfort and support.  Wearing splints at night to properly position your feet.  Taking over-the-counter medicine to relieve pain.  Being treated with high-intensity sound waves to break up the heel spur (extracorporeal shock wave therapy).  Getting steroid injections in your heel to reduce swelling and ease pain.  Having surgery if your heel spur causes long-term (chronic) pain.  Follow these instructions at home:  Take medicines only as directed by your health care provider.  Ask  your health care provider if you should use ice or cold packs on the painful areas of your heel or foot.  Avoid activities that cause you pain until you recover or as directed by your health care provider.  Stretch before exercising or being physically active.  Wear supportive shoes that fit well as directed by your health care provider. You might need to buy new shoes. Wearing old shoes or shoes that do not fit correctly may not provide the support that you need.  Lose weight if your health care provider thinks you should. This can relieve pressure on your foot that may be causing pain and discomfort. Contact a health care provider if:  Your pain continues or gets worse. This information is not intended to replace advice given to you by your health care provider. Make sure you discuss any questions you have with your health care provider. Document Released: 10/08/2005 Document Revised: 02/07/2016 Document Reviewed: 11/02/2013 Elsevier Interactive Patient Education  [employer].

## 2018-08-31 ENCOUNTER — Other Ambulatory Visit: Payer: Self-pay | Admitting: Physician Assistant

## 2018-08-31 DIAGNOSIS — K219 Gastro-esophageal reflux disease without esophagitis: Secondary | ICD-10-CM

## 2018-09-13 ENCOUNTER — Encounter: Payer: Self-pay | Admitting: Podiatry

## 2018-09-13 ENCOUNTER — Ambulatory Visit: Payer: Medicare HMO | Admitting: Podiatry

## 2018-09-13 VITALS — BP 168/78 | HR 56

## 2018-09-13 DIAGNOSIS — M722 Plantar fascial fibromatosis: Secondary | ICD-10-CM | POA: Diagnosis not present

## 2018-09-13 NOTE — Progress Notes (Signed)
This patient presents the office with chief complaint of severe pain noted in her right heel.  She says she has been experiencing pain for approximately 3 to 4 weeks when she is walking.  She says she has tried a Medrol Dosepak which did not help.  She has been under the care and treatment of Dr. Bary Castilla since November.  X-rays were taken in November revealing significant calcification at the insertion of the plantar fascia and the Achilles tendon right foot.  She presents to the office today with her son for an evaluation and treatment of her painful right foot.  Patient is taking eliquiss and therefore is not a candidate for NSAIDs  Vascular  Dorsalis pedis and posterior tibial pulses are palpable  B/L.  Capillary return  WNL.  Temperature gradient is  WNL.  Skin turgor  WNL  Sensorium  Senn Weinstein monofilament wire  WNL. Normal tactile sensation.  Nail Exam  Patient has normal nails with no evidence of bacterial or fungal infection.  Orthopedic  Exam  Muscle tone and muscle strength  WNL.  No limitations of motion feet  B/L.  No crepitus or joint effusion noted.  Foot type is unremarkable and digits show no abnormalities.  Bony prominences are unremarkable. Palpable pain noted at the insertion plantar fascia right foot.  Pain along the course of the plantar fascia right heel.  Skin  No open lesions.  Normal skin texture and turgor.  Plantar fasciitis right foot.  IE  Injection therapy right heel.  Injection therapy using 1.0 cc. Of 2% xylocaine( 20 mg.) plus 1 cc. of kenalog-la ( 10 mg) plus 1/2 cc. of dexamethazone phosphate ( 2 mg).  RTC 4 weeks.  Discussed shoegear with this patient.   Gardiner Barefoot DPM

## 2018-10-11 ENCOUNTER — Ambulatory Visit: Payer: Medicare HMO | Admitting: Podiatry

## 2018-10-11 ENCOUNTER — Encounter: Payer: Self-pay | Admitting: Podiatry

## 2018-10-11 DIAGNOSIS — M722 Plantar fascial fibromatosis: Secondary | ICD-10-CM | POA: Diagnosis not present

## 2018-10-11 NOTE — Progress Notes (Signed)
TThis patient presents the office for continued evaluation of her right heel.  She was treated with injection therapy and spenco insoles.  She says she is feeling great and is 100% improved.  She returns for continued evaluation and treatment.  Vascular  Dorsalis pedis and posterior tibial pulses are palpable  B/L.  Capillary return  WNL.  Temperature gradient is  WNL.  Skin turgor  WNL  Sensorium  Senn Weinstein monofilament wire  WNL. Normal tactile sensation.  Nail Exam  Patient has normal nails with no evidence of bacterial or fungal infection.  Orthopedic  Exam  Muscle tone and muscle strength  WNL.  No limitations of motion feet  B/L.  No crepitus or joint effusion noted.  Foot type is unremarkable and digits show no abnormalities.  Bony prominences are unremarkable.   Pain along the course of the plantar fascia right foot..  Skin  No open lesions.  Normal skin texture and turgor.  Plantar fasciitis right foot.  ROV.  Continue wearing the spenco both feet.  RTC prn.   Gardiner Barefoot DPM

## 2018-11-29 IMAGING — MR MR HEAD W/O CM
10 series · 48 of 48 positions shown · non-contrast
Comparison: Brain MRI 03/30/2017

CLINICAL DATA: Headache, vision disturbance, memory loss and
difficulty word finding.

EXAM:
MRI HEAD WITHOUT CONTRAST
TECHNIQUE: Multiplanar, multiecho pulse sequences of the brain and surrounding
structures were obtained without intravenous contrast.

[Series 2: T1 · sagittal · 5.0mm · 0.45mm/px · 3 of 23 slices shown (1 of 2)]
[im 1/23]
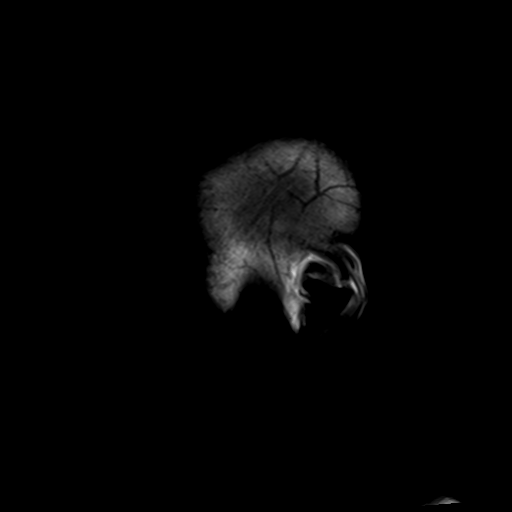
[im 12/23]
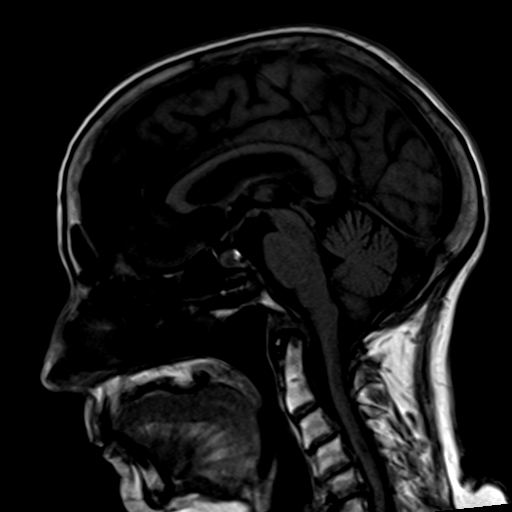
[im 23/23]
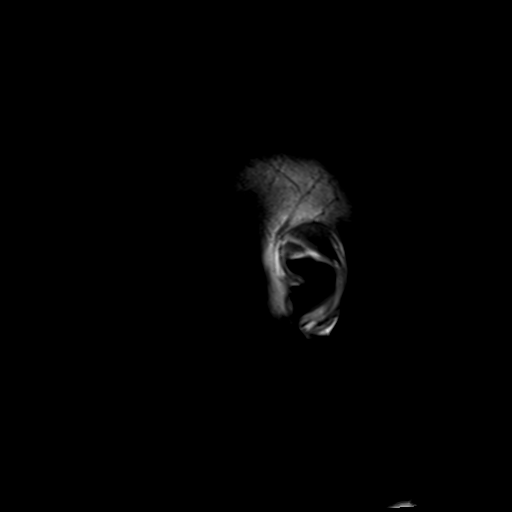

[Series 4: DWI · axial · 3.0mm · 1.20mm/px · z∈[-53,+108]mm · 5 of 55 slices shown (1 of 4)]
[im 1/55]
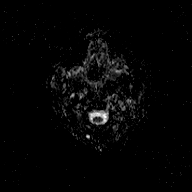
[im 14/55]
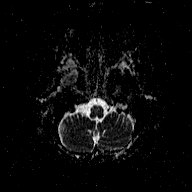
[im 28/55]
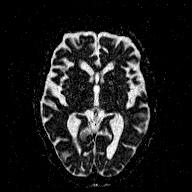
[im 41/55]
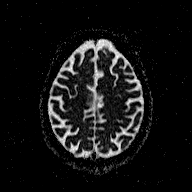
[im 55/55]
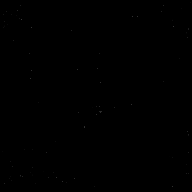

[Series 6: DWI · coronal · 3.0mm · 1.15mm/px · 4 of 48 slices shown (2 of 4)]
[im 1/48]
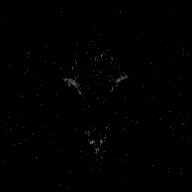
[im 16/48]
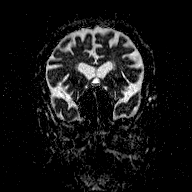
[im 32/48]
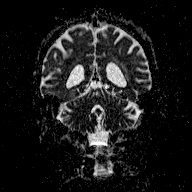
[im 48/48]
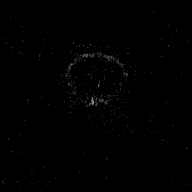

[Series 7: T2 · axial · 5.0mm · 0.72mm/px · z∈[-52,+108]mm · 2 of 24 slices shown (1 of 3)]
[im 1/24]
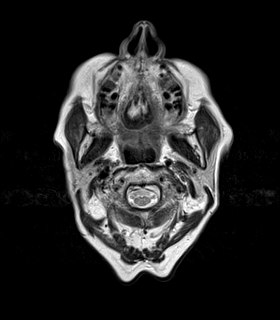
[im 24/24]
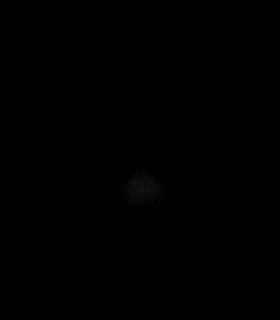

[Series 8: FLAIR · axial · 3.0mm · 0.45mm/px · z∈[-53,+109]mm · 5 of 55 slices shown]
[im 1/55]
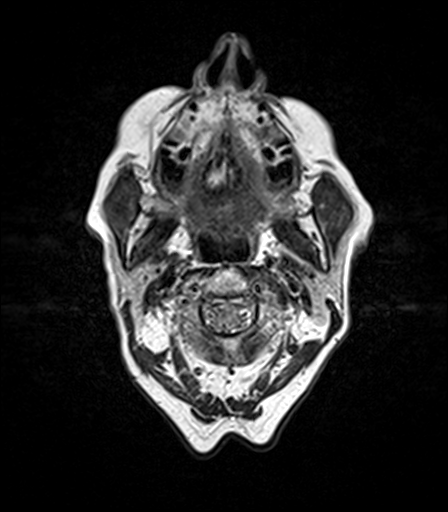
[im 14/55]
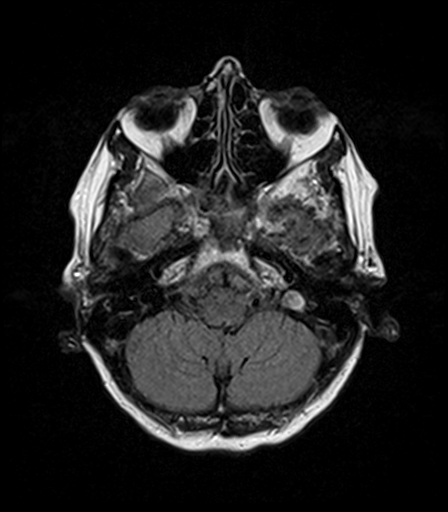
[im 28/55]
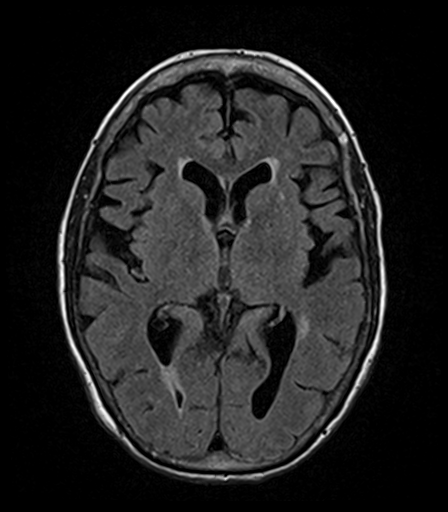
[im 41/55]
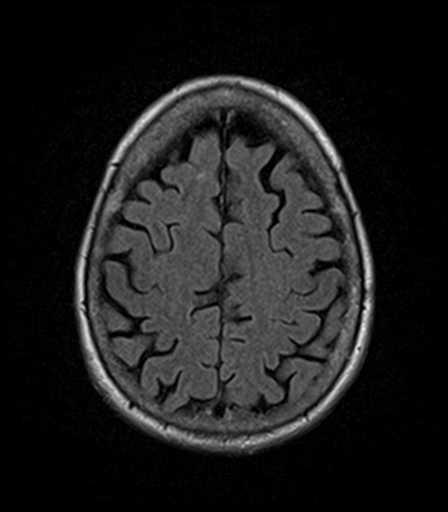
[im 55/55]
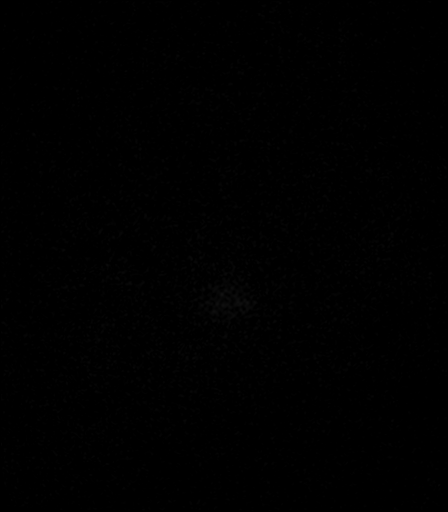

[Series 9: T2 · axial · 5.0mm · 0.72mm/px · z∈[-52,+108]mm · 2 of 24 slices shown (2 of 3)]
[im 1/24]
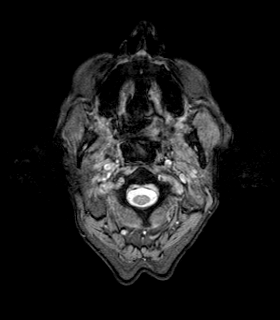
[im 24/24]
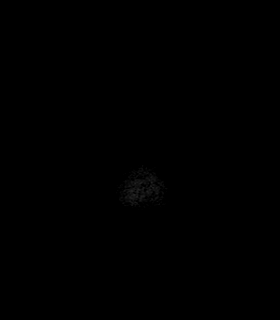

[Series 10: T1 · axial · 1.0mm · 1.00mm/px · z∈[-52,+106]mm · 15 of 160 slices shown (2 of 2)]
[im 1/160]
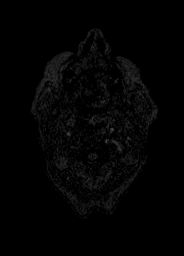
[im 12/160]
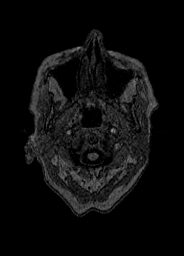
[im 23/160]
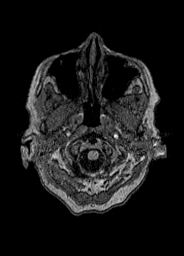
[im 35/160]
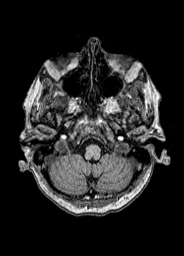
[im 46/160]
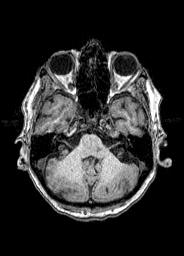
[im 57/160]
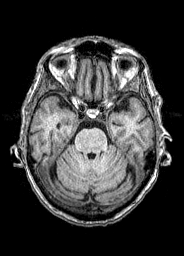
[im 69/160]
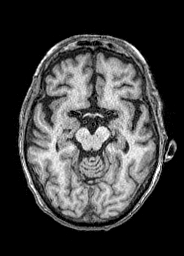
[im 80/160]
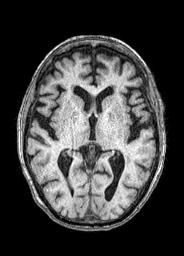
[im 91/160]
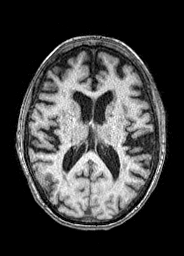
[im 103/160]
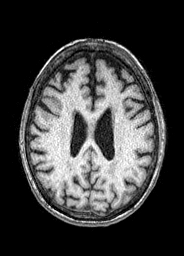
[im 114/160]
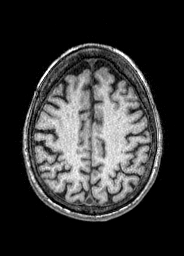
[im 125/160]
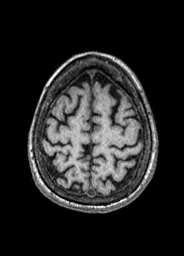
[im 137/160]
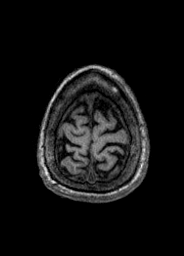
[im 148/160]
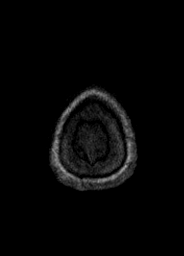
[im 160/160]
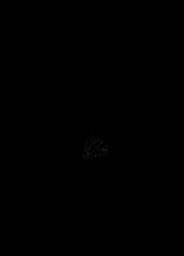

[Series 11: T2 · coronal · 5.0mm · 0.43mm/px · 3 of 32 slices shown (3 of 3)]
[im 1/32]
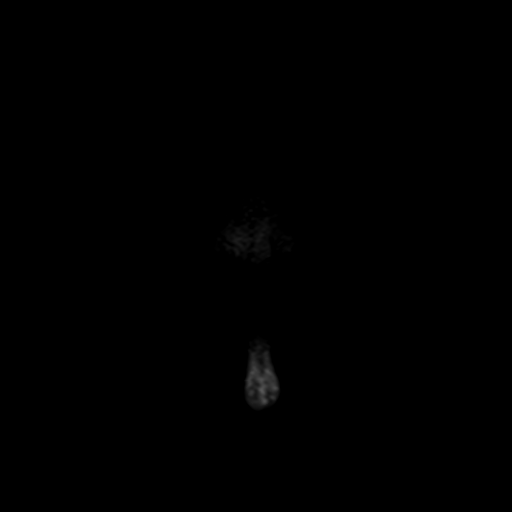
[im 16/32]
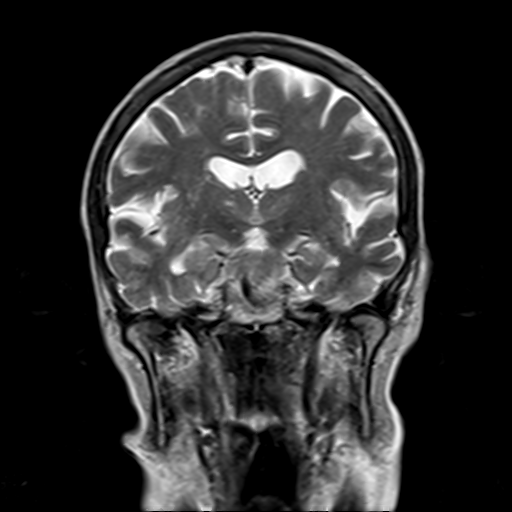
[im 32/32]
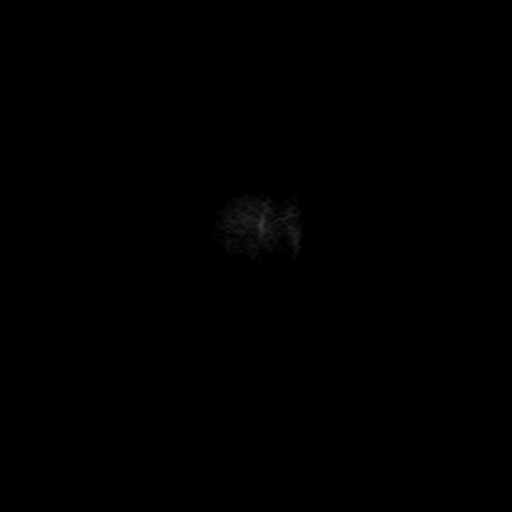

[Series 100: DWI · axial · 3.0mm · 1.20mm/px · z∈[-53,+108]mm · 5 of 54 slices shown (3 of 4)]
[im 1/54]
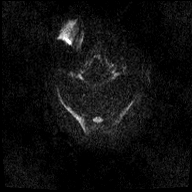
[im 14/54]
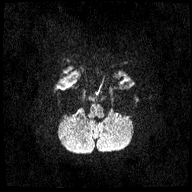
[im 27/54]
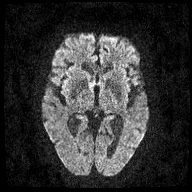
[im 40/54]
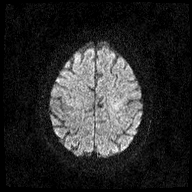
[im 54/54]
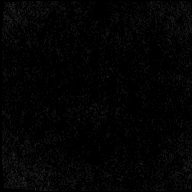

[Series 101: DWI · coronal · 3.0mm · 1.15mm/px · 4 of 47 slices shown (4 of 4)]
[im 1/47]
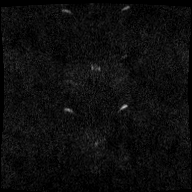
[im 16/47]
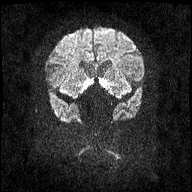
[im 31/47]
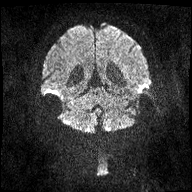
[im 47/47]
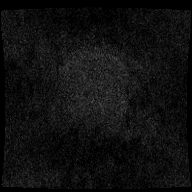

[48 of 48 positions shown; findings below may reference images not displayed]

FINDINGS: BRAIN: The midline structures are normal. There is no acute infarct
or acute hemorrhage. There is no mass lesion or other mass effect.
There is no hydrocephalus, dural abnormality or extra-axial
collection. Multifocal white matter hyperintensity, most commonly
due to chronic ischemic microangiopathy. Unchanged since 03/30/2017.
Generalized atrophy without lobar predilection. No chronic
microhemorrhage or superficial siderosis.

VASCULAR: Major intracranial arterial and venous sinus flow voids
are preserved.

SKULL AND UPPER CERVICAL SPINE: The visualized skull base,
calvarium, upper cervical spine and extracranial soft tissues are
normal.

SINUSES/ORBITS: No fluid levels or advanced mucosal thickening. No
mastoid or middle ear effusion. The orbits are normal.
IMPRESSION: Atrophy and chronic small vessel disease without acute intracranial
abnormality.

## 2018-12-02 ENCOUNTER — Other Ambulatory Visit: Payer: Self-pay | Admitting: Physician Assistant

## 2018-12-02 DIAGNOSIS — F411 Generalized anxiety disorder: Secondary | ICD-10-CM

## 2018-12-14 ENCOUNTER — Other Ambulatory Visit: Payer: Self-pay

## 2018-12-14 MED ORDER — APIXABAN 2.5 MG PO TABS: 2.5000 mg | ORAL_TABLET | Freq: Two times a day (BID) | ORAL | 2 refills | Status: DC

## 2018-12-14 NOTE — Telephone Encounter (Signed)
Please review for refill on Eliquis  

## 2018-12-14 NOTE — Telephone Encounter (Signed)
*  STAT* If patient is at the pharmacy, call can be transferred to refill team.   1. Which medications need to be refilled? (please list name of each medication and dose if known) Eliquis  2. Which pharmacy/location (including street and city if local pharmacy) is medication to be sent to? [facility] [place] [facility]  3. Do they need a 30 day or 90 day supply? Pt request 60 day

## 2018-12-14 NOTE — Telephone Encounter (Signed)
Pt last saw Dr Rockey Situ 12/29/17, last labs 02/14/18 Creat 1.16, age 85, weight 57.6kg, based on specified criteria pt is on appropriate dosage of Eliquis 2.5mg  BID.  Will refill rx. Pt requesting 60 day supply

## 2019-01-31 ENCOUNTER — Telehealth: Payer: Self-pay

## 2019-01-31 NOTE — Telephone Encounter (Signed)
Virtual Visit Pre-Appointment Phone Call  "Amanda Robinson, I am calling you today to discuss your upcoming appointment. We are currently trying to limit exposure to the virus that causes COVID-19 by seeing patients at home rather than in the office."  1. "What is the BEST phone number to call the day of the visit?" - include this in appointment notes  2. Do you have or have access to (through a family member/friend) a smartphone with video capability that we can use for your visit?" a. If yes - list this number in appt notes as cell (if different from BEST phone #) and list the appointment type as a VIDEO visit in appointment notes b. If no - list the appointment type as a PHONE visit in appointment notes  3. Confirm consent - "In the setting of the current Covid19 crisis, you are scheduled for a phone visit with your provider on Feb 02, 2019 at 1:40PM.  Just as we do with many in-office visits, in order for you to participate in this visit, we must obtain consent.  If you'd like, I can send this to your mychart (if signed up) or email for you to review.  Otherwise, I can obtain your verbal consent now.  All virtual visits are billed to your insurance company just like a normal visit would be.  By agreeing to a virtual visit, we'd like you to understand that the technology does not allow for your provider to perform an examination, and thus may limit your provider's ability to fully assess your condition. If your provider identifies any concerns that need to be evaluated in person, we will make arrangements to do so.  Finally, though the technology is pretty good, we cannot assure that it will always work on either your or our end, and in the setting of a video visit, we may have to convert it to a phone-only visit.  In either situation, we cannot ensure that we have a secure connection.  Are you willing to proceed?" STAFF: Did the patient verbally acknowledge consent to telehealth visit? Document  YES/NO here: YES  4. Advise patient to be prepared - "Two hours prior to your appointment, go ahead and check your blood pressure, pulse, oxygen saturation, and your weight (if you have the equipment to check those) and write them all down. When your visit starts, your provider will ask you for this information. If you have an Apple Watch or Kardia device, please plan to have heart rate information ready on the day of your appointment. Please have a pen and paper handy nearby the day of the visit as well."  5. Give patient instructions for MyChart download to smartphone OR [email or web address] as below if video visit (depending on what platform provider is using)  6. Inform patient they will receive a phone call 15 minutes prior to their appointment time (may be from unknown caller ID) so they should be prepared to answer    TELEPHONE CALL NOTE  Amanda Robinson has been deemed a candidate for a follow-up tele-health visit to limit community exposure during the Covid-19 pandemic. I spoke with the patient via phone to ensure availability of phone/video source, confirm preferred email & phone number, and discuss instructions and expectations.  I reminded Amanda Robinson to be prepared with any vital sign and/or heart rhythm information that could potentially be obtained via home monitoring, at the time of her visit. I reminded Amanda Robinson to expect a phone call prior to  her visit.  Amanda Robinson 01/31/2019 2:22 PM    FULL LENGTH CONSENT FOR TELE-HEALTH VISIT   I hereby voluntarily request, consent and authorize [facility] and its employed or contracted physicians, physician assistants, nurse practitioners or other licensed health care professionals (the Practitioner), to provide me with telemedicine health care services (the Services") as deemed necessary by the treating Practitioner. I acknowledge and consent to receive the Services by the Practitioner via telemedicine. I  understand that the telemedicine visit will involve communicating with the Practitioner through live audiovisual communication technology and the disclosure of certain medical information by electronic transmission. I acknowledge that I have been given the opportunity to request an in-person assessment or other available alternative prior to the telemedicine visit and am voluntarily participating in the telemedicine visit.  I understand that I have the right to withhold or withdraw my consent to the use of telemedicine in the course of my care at any time, without affecting my right to future care or treatment, and that the Practitioner or I may terminate the telemedicine visit at any time. I understand that I have the right to inspect all information obtained and/or recorded in the course of the telemedicine visit and may receive copies of available information for a reasonable fee.  I understand that some of the potential risks of receiving the Services via telemedicine include:   Delay or interruption in medical evaluation due to technological equipment failure or disruption;  Information transmitted may not be sufficient (e.g. poor resolution of images) to allow for appropriate medical decision making by the Practitioner; and/or   In rare instances, security protocols could fail, causing a breach of personal health information.  Furthermore, I acknowledge that it is my responsibility to provide information about my medical history, conditions and care that is complete and accurate to the best of my ability. I acknowledge that Practitioner's advice, recommendations, and/or decision may be based on factors not within their control, such as incomplete or inaccurate data provided by me or distortions of diagnostic images or specimens that may result from electronic transmissions. I understand that the practice of medicine is not an exact science and that Practitioner makes no warranties or guarantees  regarding treatment outcomes. I acknowledge that I will receive a copy of this consent concurrently upon execution via email to the email address I last provided but may also request a printed copy by calling the office of [place].    I understand that my insurance will be billed for this visit.   I have read or had this consent read to me.  I understand the contents of this consent, which adequately explains the benefits and risks of the Services being provided via telemedicine.   I have been provided ample opportunity to ask questions regarding this consent and the Services and have had my questions answered to my satisfaction.  I give my informed consent for the services to be provided through the use of telemedicine in my medical care  By participating in this telemedicine visit I agree to the above.

## 2019-02-01 DIAGNOSIS — I700 Atherosclerosis of aorta: Secondary | ICD-10-CM | POA: Insufficient documentation

## 2019-02-01 NOTE — Progress Notes (Signed)
Virtual Visit via Telephone Note   This visit type was conducted due to national recommendations for restrictions regarding the COVID-19 Pandemic (e.g. social distancing) in an effort to limit this patient's exposure and mitigate transmission in our community.  Due to her co-morbid illnesses, this patient is at least at moderate risk for complications without adequate follow up.  This format is felt to be most appropriate for this patient at this time.  The patient did not have access to video technology/had technical difficulties with video requiring transitioning to audio format only (telephone).  All issues noted in this document were discussed and addressed.  No physical exam could be performed with this format.  Please refer to the patient's chart for her  consent to telehealth for [facility].   I connected with  Amanda Robinson on 02/02/19 by a video enabled telemedicine application and verified that I am speaking with the correct person using two identifiers. I discussed the limitations of evaluation and management by telemedicine. The patient expressed understanding and agreed to proceed.   Evaluation Performed:  Follow-up visit  Date:  02/02/2019   ID:  Amanda Robinson [id number], MRN [id number]  Patient Location:  [address] [place] [address]   Provider location:   [facility] [place] office  PCP:  Mar Daring, PA-C  Cardiologist:  Patsy Baltimore   Chief Complaint:  Prior seizure   History of Present Illness:    Amanda Robinson is a 85 y.o. female who presents via Engineer, civil (consulting) for a telehealth visit today.   The patient does not symptoms concerning for COVID-19 infection (fever, chills, cough, or new SHORTNESS OF BREATH).   Patient has a past medical history of H/a. Wakes at night with thrubbing Ears out of wack Occipital CVA workup 2018  went to her primary care physician's office 10/15/2017 because of  urinary symptoms.  issues with the frequency.   found to be tachycardic with an irregular rhythm. Rate 160 bpm Felt to be in atrial fibrillation and sent to the ER She presents for follow-up of her paroxysmal atrial fibrillation  "had some kind of seizure", was in 02/2018 Martin Majestic to the [facility] records reviewed with the patient in detail On keppra, headaches better No more seizures Followed by Dr. Manuella Ghazi  Denies any tachycardia or palpitations concerning for atrial fibrillation No chest pain or shortness of breath  Active Has not had labs in 1 year  EKG 02/2018: NSR  Does not want more statin   Other hx reviewed Previous CT neck: 03/2017 results reviewed with her in detail Aortic athero cartoid athero   Previous roaring and palpation in her head, this is not an issue on today's visit Dating back months to years Previous stroke with workup 2018   Prior CV studies:   The following studies were reviewed today:  Echocardiogram July 2018 Normal LV function ejection fraction 55-60% normal left atrial size Negative bubble study RV normal size and function   Past Medical History:  Diagnosis Date  . Afib ([place])   . CAD (coronary artery disease)   . Chronic kidney disease   . Skin cancer    Past Surgical History:  Procedure Laterality Date  . ABDOMINAL HYSTERECTOMY  1963  . BREAST SURGERY Bilateral 1985   Biopsy  . CHOLECYSTECTOMY  03/1998  . 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 PARTIAL COLECTOMY  1963     Current  Meds  Medication Sig  . apixaban (ELIQUIS) 2.5 MG TABS tablet Take 1 tablet (2.5 mg total) by mouth 2 (two) times daily.  . butalbital-acetaminophen-caffeine (FIORICET, ESGIC) 50-325-40 MG tablet Take 1 tablet by mouth every 4 (four) hours as needed for headache or migraine.  . levETIRAcetam (KEPPRA XR) 500 MG 24 hr tablet TAKE 2 TABLETS (1,000 MG TOTAL) BY MOUTH ONCE DAILY  . metoprolol tartrate (LOPRESSOR) 25 MG tablet Take 1  tablet (25 mg total) by mouth 2 (two) times daily.  . pantoprazole (PROTONIX) 40 MG tablet TAKE 1 TABLET (40 MG TOTAL) BY MOUTH DAILY.  Marland Kitchen PARoxetine (PAXIL) 20 MG tablet TAKE 1 TABLET BY MOUTH EVERY DAY  . rosuvastatin (CRESTOR) 20 MG tablet   . senna-docusate (SENOKOT-S) 8.6-50 MG tablet Take 1 tablet by mouth at bedtime as needed for mild constipation.     Allergies:   Clarithromycin; Colesevelam hcl; and Propoxyphene   Social History   Tobacco Use  . Smoking status: Never Smoker  . Smokeless tobacco: Never Used  Substance Use Topics  . Alcohol use: No  . Drug use: No     Current Outpatient Medications on File Prior to Visit  Medication Sig Dispense Refill  . apixaban (ELIQUIS) 2.5 MG TABS tablet Take 1 tablet (2.5 mg total) by mouth 2 (two) times daily. 120 tablet 2  . butalbital-acetaminophen-caffeine (FIORICET, ESGIC) 50-325-40 MG tablet Take 1 tablet by mouth every 4 (four) hours as needed for headache or migraine. 14 tablet 0  . levETIRAcetam (KEPPRA XR) 500 MG 24 hr tablet TAKE 2 TABLETS (1,000 MG TOTAL) BY MOUTH ONCE DAILY    . metoprolol tartrate (LOPRESSOR) 25 MG tablet Take 1 tablet (25 mg total) by mouth 2 (two) times daily. 180 tablet 3  . pantoprazole (PROTONIX) 40 MG tablet TAKE 1 TABLET (40 MG TOTAL) BY MOUTH DAILY. 90 tablet 3  . PARoxetine (PAXIL) 20 MG tablet TAKE 1 TABLET BY MOUTH EVERY DAY 90 tablet 3  . rosuvastatin (CRESTOR) 20 MG tablet     . senna-docusate (SENOKOT-S) 8.6-50 MG tablet Take 1 tablet by mouth at bedtime as needed for mild constipation.    . simvastatin (ZOCOR) 20 MG tablet Take 1 tablet (20 mg total) by mouth at bedtime. (Patient not taking: Reported on 02/02/2019) 90 tablet 3   No current facility-administered medications on file prior to visit.      Family Hx: The patient's family history includes Alcohol abuse in her brother; Alzheimer's disease in her father; Brain cancer in her sister; Colon cancer in an other family member; Esophageal  cancer in her mother; Ovarian cancer in an other family member; Parkinsonism in her brother.  ROS:   Please see the history of present illness.    Review of Systems  Constitutional: Negative.   Respiratory: Negative.   Cardiovascular: Negative.   Gastrointestinal: Negative.   Musculoskeletal: Negative.   Neurological: Negative.   Psychiatric/Behavioral: Negative.   All other systems reviewed and are negative.    Labs/Other Tests and Data Reviewed:    Recent Labs: 02/14/2018: BUN 16; Creatinine, Ser 1.16; Hemoglobin 10.4; Platelets 172; Potassium 3.9; Sodium 137   Recent Lipid Panel Lab Results  Component Value Date/Time   CHOL 216 (H) 12/22/2017 08:42 AM   TRIG 110 12/22/2017 08:42 AM   HDL 75 12/22/2017 08:42 AM   CHOLHDL 2.9 12/22/2017 08:42 AM   CHOLHDL 3.1 06/29/2017 08:51 AM   LDLCALC 119 (H) 12/22/2017 08:42 AM   LDLCALC 134 (H) 06/29/2017 08:51 AM  Wt Readings from Last 3 Encounters:  02/02/19 125 lb (56.7 kg)  08/30/18 127 lb (57.6 kg)  08/27/18 126 lb 12.8 oz (57.5 kg)     Exam:    Vital Signs: Vital signs may also be detailed in the HPI BP (!) 131/58   Ht 5\' 2"  (1.575 m)   Wt 125 lb (56.7 kg)   BMI 22.86 kg/m   Wt Readings from Last 3 Encounters:  02/02/19 125 lb (56.7 kg)  08/30/18 127 lb (57.6 kg)  08/27/18 126 lb 12.8 oz (57.5 kg)   Temp Readings from Last 3 Encounters:  08/30/18 98 F (36.7 C) (Oral)  08/27/18 97.9 F (36.6 C) (Oral)  08/04/18 98.3 F (36.8 C) (Oral)   BP Readings from Last 3 Encounters:  02/02/19 (!) 131/58  09/13/18 (!) 168/78  08/30/18 125/70   Pulse Readings from Last 3 Encounters:  09/13/18 (!) 56  08/30/18 (!) 57  08/27/18 (!) 56     131/58, Pulse 50s, resp 23  Well nourished, well developed female in no acute distress. Constitutional:  oriented to person, place, and time. No distress.    ASSESSMENT & PLAN:    PAF (paroxysmal atrial fibrillation) (HCC) On eliquis, and metoprolol  Cerebrovascular  accident (CVA) due to embolism of precerebral artery (HCC) On anticoalgulation,  Aortic atherosclerosis ([place]) Not on statin, declining crestor Would like to retry simvastatin  PAD (peripheral artery disease) ([place]) Will restart simva  Mixed hyperlipidemia Needs new labs   COVID-19 Education: The signs and symptoms of COVID-19 were discussed with the patient and how to seek care for testing (follow up with PCP or arrange E-visit).  The importance of social distancing was discussed today.  Patient Risk:   After full review of this patients clinical status, I feel that they are at least moderate risk at this time.  Time:   Today, I have spent 25 minutes with the patient with telehealth technology discussing the cardiac and medical problems/diagnoses detailed above   10 min spent reviewing the chart prior to patient visit today   Medication Adjustments/Labs and Tests Ordered: Current medicines are reviewed at length with the patient today.  Concerns regarding medicines are outlined above.   Tests Ordered: No tests ordered   Medication Changes: No changes made   Disposition: Follow-up in 12 months   Signed, Ida Rogue, MD  02/02/2019 2:09 PM    [place] Office [address], [place], [place] [address]

## 2019-02-02 ENCOUNTER — Other Ambulatory Visit: Payer: Self-pay

## 2019-02-02 ENCOUNTER — Telehealth (INDEPENDENT_AMBULATORY_CARE_PROVIDER_SITE_OTHER): Payer: Medicare HMO | Admitting: Cardiovascular Disease

## 2019-02-02 VITALS — BP 131/58 | Ht 62.0 in | Wt 125.0 lb

## 2019-02-02 DIAGNOSIS — I700 Atherosclerosis of aorta: Secondary | ICD-10-CM

## 2019-02-02 DIAGNOSIS — I480 Paroxysmal atrial fibrillation: Secondary | ICD-10-CM

## 2019-02-02 DIAGNOSIS — E782 Mixed hyperlipidemia: Secondary | ICD-10-CM | POA: Diagnosis not present

## 2019-02-02 DIAGNOSIS — I739 Peripheral vascular disease, unspecified: Secondary | ICD-10-CM | POA: Diagnosis not present

## 2019-02-02 DIAGNOSIS — E7800 Pure hypercholesterolemia, unspecified: Secondary | ICD-10-CM

## 2019-02-02 DIAGNOSIS — I6310 Cerebral infarction due to embolism of unspecified precerebral artery: Secondary | ICD-10-CM | POA: Diagnosis not present

## 2019-02-02 MED ORDER — SIMVASTATIN 20 MG PO TABS
20.0000 mg | ORAL_TABLET | Freq: Every day | ORAL | 3 refills | Status: AC
Start: 2019-02-02 — End: ?

## 2019-02-02 NOTE — Patient Instructions (Addendum)
Medication Instructions:  Your physician has recommended you make the following change in your medication:  1. STOP Rosuvastatin (Crestor) 2. RESTART Simvastatin (Zocor) 20 mg once daily   If you need a refill on your cardiac medications before your next appointment, please call your pharmacy.    Lab work: No new labs needed   If you have labs (blood work) drawn today and your tests are completely normal, you will receive your results only by: Marland Kitchen MyChart Message (if you have MyChart) OR . A paper copy in the mail If you have any lab test that is abnormal or we need to change your treatment, we will call you to review the results.   Testing/Procedures: No new testing needed   Follow-Up: At [facility], you and your health needs are our priority.  As part of our continuing mission to provide you with exceptional heart care, we have created designated Provider Care Teams.  These Care Teams include your primary Cardiologist (physician) and Advanced Practice Providers (APPs -  Physician Assistants and Nurse Practitioners) who all work together to provide you with the care you need, when you need it.  . You will need a follow up appointment in 12 months .   Please call our office 2 months in advance to schedule this appointment.    . Providers on your designated Care Team:   . Murray Hodgkins, NP . Christell Faith, PA-C . Marrianne Mood, PA-C  Any Other Special Instructions Will Be Listed Below (If Applicable).  For educational health videos Log in to : [email or web address] Or : [email or web address], password : [email or web address]

## 2019-04-11 DIAGNOSIS — D692 Other nonthrombocytopenic purpura: Secondary | ICD-10-CM | POA: Diagnosis not present

## 2019-04-11 DIAGNOSIS — L570 Actinic keratosis: Secondary | ICD-10-CM | POA: Diagnosis not present

## 2019-04-11 DIAGNOSIS — Z85828 Personal history of other malignant neoplasm of skin: Secondary | ICD-10-CM | POA: Diagnosis not present

## 2019-04-11 DIAGNOSIS — L820 Inflamed seborrheic keratosis: Secondary | ICD-10-CM | POA: Diagnosis not present

## 2019-04-11 DIAGNOSIS — L821 Other seborrheic keratosis: Secondary | ICD-10-CM | POA: Diagnosis not present

## 2019-06-09 ENCOUNTER — Other Ambulatory Visit: Payer: Self-pay | Admitting: Physician Assistant

## 2019-06-09 ENCOUNTER — Other Ambulatory Visit: Payer: Self-pay | Admitting: Cardiovascular Disease

## 2019-06-09 DIAGNOSIS — R569 Unspecified convulsions: Secondary | ICD-10-CM

## 2019-06-09 DIAGNOSIS — G40909 Epilepsy, unspecified, not intractable, without status epilepticus: Secondary | ICD-10-CM

## 2019-06-09 MED ORDER — APIXABAN 2.5 MG PO TABS: 2.5000 mg | ORAL_TABLET | Freq: Two times a day (BID) | ORAL | 1 refills | Status: AC

## 2019-06-09 MED ORDER — LEVETIRACETAM ER 500 MG PO TB24: ORAL_TABLET | ORAL | 1 refills | Status: DC

## 2019-06-09 NOTE — Telephone Encounter (Signed)
Please advise refill? 

## 2019-06-09 NOTE — Telephone Encounter (Signed)
Refill Request.  

## 2019-06-09 NOTE — Telephone Encounter (Signed)
Pt needing refills on:  apixaban (ELIQUIS) 2.5 MG TABS tablet  levETIRAcetam (KEPPRA XR) 500 MG 24 hr tablet   Please fill at:  [id number] - [place], [place] - [address] [phone] (Phone) [phone] (Fax)   Thanks, [employer]

## 2019-06-09 NOTE — Telephone Encounter (Signed)
82f 57.6kg Scr 1.16 02/14/18 Lovw/gollan 02/02/19

## 2019-06-10 DIAGNOSIS — Z79899 Other long term (current) drug therapy: Secondary | ICD-10-CM | POA: Diagnosis not present

## 2019-06-10 DIAGNOSIS — R410 Disorientation, unspecified: Secondary | ICD-10-CM | POA: Diagnosis not present

## 2019-06-10 DIAGNOSIS — G40909 Epilepsy, unspecified, not intractable, without status epilepticus: Secondary | ICD-10-CM | POA: Diagnosis not present

## 2019-06-17 ENCOUNTER — Other Ambulatory Visit: Payer: Self-pay

## 2019-06-17 ENCOUNTER — Ambulatory Visit (INDEPENDENT_AMBULATORY_CARE_PROVIDER_SITE_OTHER): Payer: Medicare HMO | Admitting: Family Medicine

## 2019-06-17 DIAGNOSIS — Z23 Encounter for immunization: Secondary | ICD-10-CM | POA: Diagnosis not present

## 2019-06-30 ENCOUNTER — Encounter: Payer: Medicare HMO | Admitting: Physician Assistant

## 2019-07-04 NOTE — Progress Notes (Signed)
[name], Female    DOB: 1922-02-11, 85 y.o.   MRN: [id number] Visit Date: 07/05/2019  Today's Provider: Mar Daring, PA-C   Chief Complaint  Patient presents with   Medicare Wellness   Subjective:     Annual Physical Exam Amanda Robinson is a 85 y.o. female. She feels well. She reports exercising none. She reports she is sleeping fairly well. -----------------------------------------------------------   Review of Systems  Constitutional: Negative.   HENT: Positive for tinnitus.   Eyes: Negative.   Respiratory: Negative.   Cardiovascular: Negative.   Gastrointestinal: Negative.   Endocrine: Negative.   Genitourinary: Negative.   Musculoskeletal: Positive for arthralgias.  Skin: Negative.   Allergic/Immunologic: Negative.   Neurological: Negative.   Hematological: Negative.   Psychiatric/Behavioral: Negative.     Social History   Socioeconomic History   Marital status: Married    Spouse name: Not on file   Number of children: 1   Years of education: Not on file   Highest education level: High school graduate  Occupational History   Occupation: retired  Scientist, product/process development strain: Not hard at [employer] insecurity    Worry: Never true    Inability: Never true   Transportation needs    Medical: No    Non-medical: No  Tobacco Use   Smoking status: Never Smoker   Smokeless tobacco: Never Used  Substance and Sexual Activity   Alcohol use: No   Drug use: No   Sexual activity: Not Currently  Lifestyle   Physical activity    Days per week: 0 days    Minutes per session: 0 min   Stress: Only a little  Relationships   Social connections    Talks on phone: Patient refused    Gets together: Patient refused    Attends religious service: Patient refused    Active member of club or organization: Patient refused    Attends meetings of clubs or organizations: Patient refused    Relationship status:  Patient refused   Intimate partner violence    Fear of current or ex partner: Patient refused    Emotionally abused: Patient refused    Physically abused: Patient refused    Forced sexual activity: Patient refused  Other Topics Concern   Not on file  Social History Narrative   Not on file    Past Medical History:  Diagnosis Date   Afib ([place])    CAD (coronary artery disease)    Chronic kidney disease    Skin cancer      Patient Active Problem List   Diagnosis Date Noted   Aortic atherosclerosis ([place]) 02/01/2019   Seizure ([place]) 02/22/2018   Altered mental status 02/13/2018   Pounding noise in both ears 11/24/2017   CVA (cerebral vascular accident) ([place]) 11/24/2017   PAF (paroxysmal atrial fibrillation) ([place]) 11/22/2017   Osteopenia 06/04/2015   Atherosclerosis of coronary artery 04/17/2015   Colon polyp 04/17/2015   Insomnia 04/17/2015   Acid reflux 04/17/2015   Blood in the urine 04/17/2015   HLD (hyperlipidemia) 04/17/2015   Adaptive colitis 04/17/2015   Avitaminosis D 04/17/2015   Anxiety 04/05/2015   Basal cell carcinoma of nose 02/16/2006    Past Surgical History:  Procedure Laterality Date   ABDOMINAL HYSTERECTOMY  1963   BREAST SURGERY Bilateral 1985   Biopsy   CHOLECYSTECTOMY  03/1998   KIDNEY SURGERY     due to ureteropelvic stricture  PARTIAL COLECTOMY  1963    Her family history includes Alcohol abuse in her brother; Alzheimer's disease in her father; Brain cancer in her sister; Colon cancer in an other family member; Esophageal cancer in her mother; Ovarian cancer in an other family member; Parkinsonism in her brother.   Current Outpatient Medications:    apixaban (ELIQUIS) 2.5 MG TABS tablet, Take 1 tablet (2.5 mg total) by mouth 2 (two) times daily., Disp: 180 tablet, Rfl: 1   levETIRAcetam (KEPPRA XR) 500 MG 24 hr tablet, TAKE 2 TABLETS (1,000 MG TOTAL) BY MOUTH ONCE DAILY, Disp: 180 tablet, Rfl: 1   metoprolol  tartrate (LOPRESSOR) 25 MG tablet, Take 1 tablet (25 mg total) by mouth 2 (two) times daily., Disp: 180 tablet, Rfl: 3   pantoprazole (PROTONIX) 40 MG tablet, TAKE 1 TABLET (40 MG TOTAL) BY MOUTH DAILY., Disp: 90 tablet, Rfl: 3   senna-docusate (SENOKOT-S) 8.6-50 MG tablet, Take 1 tablet by mouth at bedtime as needed for mild constipation., Disp: , Rfl:    simvastatin (ZOCOR) 20 MG tablet, Take 1 tablet (20 mg total) by mouth at bedtime., Disp: 90 tablet, Rfl: 3   butalbital-acetaminophen-caffeine (FIORICET, ESGIC) 50-325-40 MG tablet, Take 1 tablet by mouth every 4 (four) hours as needed for headache or migraine. (Patient not taking: Reported on 07/05/2019), Disp: 14 tablet, Rfl: 0   PARoxetine (PAXIL) 20 MG tablet, TAKE 1 TABLET BY MOUTH EVERY DAY, Disp: 90 tablet, Rfl: 3  Patient Care Team: Mar Daring, PA-C as PCP - General (Family Medicine) Birdie Sons, MD as Referring Physician (Family Medicine)    Objective:    Vitals: BP (!) 151/71 (BP Location: Left Arm, Patient Position: Sitting, Cuff Size: Normal)    Pulse (!) 58    Temp (!) 97.5 F (36.4 C) (Other (Comment))    Resp 16    Ht 5\' 3"  (1.6 m)    Wt 129 lb 6.4 oz (58.7 kg)    BMI 22.92 kg/m   Physical Exam Vitals signs reviewed.  Constitutional:      General: She is not in acute distress.    Appearance: Normal appearance. She is well-developed and normal weight. She is not diaphoretic.  HENT:     Head: Normocephalic and atraumatic.     Right Ear: Tympanic membrane, ear canal and external ear normal.     Left Ear: Tympanic membrane, ear canal and external ear normal.     Nose: Nose normal.     Mouth/Throat:     Mouth: Mucous membranes are moist.     Pharynx: Oropharynx is clear. No oropharyngeal exudate.  Eyes:     General: No scleral icterus.       Right eye: No discharge.        Left eye: No discharge.     Extraocular Movements: Extraocular movements intact.     Conjunctiva/sclera: Conjunctivae normal.      Pupils: Pupils are equal, round, and reactive to light.  Neck:     Musculoskeletal: Normal range of motion and neck supple.     Thyroid: No thyromegaly.     Vascular: No carotid bruit or JVD.     Trachea: No tracheal deviation.  Cardiovascular:     Rate and Rhythm: Normal rate and regular rhythm.     Pulses: Normal pulses.     Heart sounds: Normal heart sounds. No murmur. No friction rub. No gallop.   Pulmonary:     Effort: Pulmonary effort is normal. No respiratory distress.  Breath sounds: Normal breath sounds. No wheezing or rales.  Chest:     Chest wall: No tenderness.  Abdominal:     General: Bowel sounds are normal. There is no distension.     Palpations: Abdomen is soft. There is no mass.     Tenderness: There is no abdominal tenderness. There is no guarding or rebound.  Musculoskeletal: Normal range of motion.        General: No tenderness.     Right lower leg: Edema (1+ pitting) present.     Left lower leg: Edema (1+ pitting) present.  Lymphadenopathy:     Cervical: No cervical adenopathy.  Skin:    General: Skin is warm and dry.     Capillary Refill: Capillary refill takes less than 2 seconds.     Findings: No rash.  Neurological:     Mental Status: She is alert and oriented to person, place, and time. Mental status is at baseline.  Psychiatric:        Mood and Affect: Mood normal.        Behavior: Behavior normal.        Thought Content: Thought content normal.        Judgment: Judgment normal.     Activities of Daily Living In your present state of health, do you have any difficulty performing the following activities: 07/05/2019 08/27/2018  Hearing? Tempie Donning  Comment wears hearing aids Wears hearing aids.  Vision? N Y  Comment - With close up print.   Difficulty concentrating or making decisions? Y Y  Comment Remembering -  Walking or climbing stairs? N N  Dressing or bathing? N N  Doing errands, shopping? N N  Preparing Food and eating ? - N  Using the  Toilet? - N  In the past six months, have you accidently leaked urine? - N  Do you have problems with loss of bowel control? - N  Managing your Medications? - N  Managing your Finances? - N  Housekeeping or managing your Housekeeping? - N  Some recent data might be hidden    Fall Risk Assessment Fall Risk  07/05/2019 08/27/2018 06/23/2017 06/18/2016 06/04/2015  Falls in the past year? 1 1 No No No  Number falls in past yr: 0 1 - - -  Injury with Fall? 0 0 - - -  Follow up Falls evaluation completed Falls prevention discussed - - -     Depression Screen PHQ 2/9 Scores 07/05/2019 08/27/2018 08/27/2018 06/23/2017  PHQ - 2 Score 0 1 1 1   PHQ- 9 Score - 3 - 3  Exception Documentation - - - -    6CIT Screen 07/05/2019  What Year? 0 points  What month? 0 points  What time? 0 points  Count back from 20 2 points  Months in reverse 0 points  Repeat phrase 6 points  Total Score 8      Assessment & Plan:     Annual Wellness Visit  Reviewed patient's Family Medical History Reviewed and updated list of patient's medical providers Assessment of cognitive impairment was done Assessed patient's functional ability Established a written schedule for health screening [place] Completed and Reviewed  Exercise Activities and Dietary recommendations Goals     Exercise 150 minutes per week (moderate activity)     Prevent falls     Recommend to remove any items from the home that may cause slips or trips.         Immunization History  Administered Date(s) Administered   Fluad Quad(high Dose 65+) 06/17/2019   Influenza Split 06/23/2011, 06/25/2012   Influenza, High Dose Seasonal PF 07/08/2014, 06/04/2015, 06/18/2016, 06/23/2017, 07/02/2018   Influenza,inj,Quad PF,6+ Mos 07/11/2013   Pneumococcal Conjugate-13 01/01/2015   Pneumococcal Polysaccharide-23 07/09/2006   Td 07/09/2006   Tdap 06/23/2011    Health Maintenance  Topic Date Due   DEXA  SCAN  07/19/1989   TETANUS/TDAP  06/22/2021   INFLUENZA VACCINE  Completed   PNA vac Low Risk Adult  Completed     Discussed health benefits of physical activity, and encouraged her to engage in regular exercise appropriate for her age and condition.    1. Annual physical exam Normal physical exam today. Will check labs as below and f/u pending lab results. If labs are stable and WNL she will not need to have these rechecked for one year at her next annual physical exam. She is to call the office in the meantime if she has any acute issue, questions or concerns. - CBC with Differential/Platelet - Comprehensive metabolic panel  2. Pure hypercholesterolemia Stable on Simvastatin 20mg . Will check labs as below and f/u pending results. - CBC with Differential/Platelet - Comprehensive metabolic panel - Hemoglobin A1c - Lipid panel - TSH  3. Aortic atherosclerosis (HCC) Stable. On statin and eliquis. Will check labs as below and f/u pending results. - CBC with Differential/Platelet - Comprehensive metabolic panel - Lipid panel - TSH  4. PAF (paroxysmal atrial fibrillation) (HCC) On Eliquis 2.5mg  BID and metoprolol 25mg  BID. Followed by cardiology, Dr. Rockey Situ. Will check labs as below and f/u pending results. - CBC with Differential/Platelet - Comprehensive metabolic panel - TSH  5. Seizure (HCC) No seizure activity since initial. On Keppra 500mg  daily. Followed by Neurology.  - CBC with Differential/Platelet - Comprehensive metabolic panel - TSH  6. Avitaminosis D H/O this. Not on supplement. Will check labs as below and f/u pending results. - CBC with Differential/Platelet - Comprehensive metabolic panel - Vitamin D (25 hydroxy)  ------------------------------------------------------------------------------------------------------------    Mar Daring, PA-C  [facility]

## 2019-07-05 ENCOUNTER — Encounter: Payer: Self-pay | Admitting: Physician Assistant

## 2019-07-05 ENCOUNTER — Ambulatory Visit (INDEPENDENT_AMBULATORY_CARE_PROVIDER_SITE_OTHER): Payer: Medicare HMO | Admitting: Physician Assistant

## 2019-07-05 ENCOUNTER — Other Ambulatory Visit: Payer: Self-pay

## 2019-07-05 VITALS — BP 151/71 | HR 58 | Temp 97.5°F | Resp 16 | Ht 63.0 in | Wt 129.4 lb

## 2019-07-05 DIAGNOSIS — R7303 Prediabetes: Secondary | ICD-10-CM | POA: Diagnosis not present

## 2019-07-05 DIAGNOSIS — E559 Vitamin D deficiency, unspecified: Secondary | ICD-10-CM | POA: Diagnosis not present

## 2019-07-05 DIAGNOSIS — R569 Unspecified convulsions: Secondary | ICD-10-CM

## 2019-07-05 DIAGNOSIS — I700 Atherosclerosis of aorta: Secondary | ICD-10-CM | POA: Diagnosis not present

## 2019-07-05 DIAGNOSIS — I480 Paroxysmal atrial fibrillation: Secondary | ICD-10-CM | POA: Diagnosis not present

## 2019-07-05 DIAGNOSIS — E7800 Pure hypercholesterolemia, unspecified: Secondary | ICD-10-CM | POA: Diagnosis not present

## 2019-07-05 DIAGNOSIS — Z0000 Encounter for general adult medical examination without abnormal findings: Secondary | ICD-10-CM | POA: Diagnosis not present

## 2019-07-05 DIAGNOSIS — E782 Mixed hyperlipidemia: Secondary | ICD-10-CM | POA: Diagnosis not present

## 2019-07-05 NOTE — Patient Instructions (Signed)
Health Maintenance After Age 83 After age 83, you are at a higher risk for certain long-term diseases and infections as well as injuries from falls. Falls are a major cause of broken bones and head injuries in people who are older than age 83. Getting regular preventive care can help to keep you healthy and well. Preventive care includes getting regular testing and making lifestyle changes as recommended by your health care provider. Talk with your health care provider about:  Which screenings and tests you should have. A screening is a test that checks for a disease when you have no symptoms.  A diet and exercise plan that is right for you. What should I know about screenings and tests to prevent falls? Screening and testing are the best ways to find a health problem early. Early diagnosis and treatment give you the best chance of managing medical conditions that are common after age 83. Certain conditions and lifestyle choices may make you more likely to have a fall. Your health care provider may recommend:  Regular vision checks. Poor vision and conditions such as cataracts can make you more likely to have a fall. If you wear glasses, make sure to get your prescription updated if your vision changes.  Medicine review. Work with your health care provider to regularly review all of the medicines you are taking, including over-the-counter medicines. Ask your health care provider about any side effects that may make you more likely to have a fall. Tell your health care provider if any medicines that you take make you feel dizzy or sleepy.  Osteoporosis screening. Osteoporosis is a condition that causes the bones to get weaker. This can make the bones weak and cause them to break more easily.  Blood pressure screening. Blood pressure changes and medicines to control blood pressure can make you feel dizzy.  Strength and balance checks. Your health care provider may recommend certain tests to check your  strength and balance while standing, walking, or changing positions.  Foot health exam. Foot pain and numbness, as well as not wearing proper footwear, can make you more likely to have a fall.  Depression screening. You may be more likely to have a fall if you have a fear of falling, feel emotionally low, or feel unable to do activities that you used to do.  Alcohol use screening. Using too much alcohol can affect your balance and may make you more likely to have a fall. What actions can I take to lower my risk of falls? General instructions  Talk with your health care provider about your risks for falling. Tell your health care provider if: ? You fall. Be sure to tell your health care provider about all falls, even ones that seem minor. ? You feel dizzy, sleepy, or off-balance.  Take over-the-counter and prescription medicines only as told by your health care provider. These include any supplements.  Eat a healthy diet and maintain a healthy weight. A healthy diet includes low-fat dairy products, low-fat (lean) meats, and fiber from whole grains, beans, and lots of fruits and vegetables. Home safety  Remove any tripping hazards, such as rugs, cords, and clutter.  Install safety equipment such as grab bars in bathrooms and safety rails on stairs.  Keep rooms and walkways well-lit. Activity   Follow a regular exercise program to stay fit. This will help you maintain your balance. Ask your health care provider what types of exercise are appropriate for you.  If you need a cane or   walker, use it as recommended by your health care provider.  Wear supportive shoes that have nonskid soles. Lifestyle  Do not drink alcohol if your health care provider tells you not to drink.  If you drink alcohol, limit how much you have: ? 0-1 drink a day for women. ? 0-2 drinks a day for men.  Be aware of how much alcohol is in your drink. In the [place] one drink equals one typical bottle of beer (12  oz), one-half glass of wine (5 oz), or one shot of hard liquor (1 oz).  Do not use any products that contain nicotine or tobacco, such as cigarettes and e-cigarettes. If you need help quitting, ask your health care provider. Summary  Having a healthy lifestyle and getting preventive care can help to protect your health and wellness after age 83.  Screening and testing are the best way to find a health problem early and help you avoid having a fall. Early diagnosis and treatment give you the best chance for managing medical conditions that are more common for people who are older than age 83.  Falls are a major cause of broken bones and head injuries in people who are older than age 83. Take precautions to prevent a fall at home.  Work with your health care provider to learn what changes you can make to improve your health and wellness and to prevent falls. This information is not intended to replace advice given to you by your health care provider. Make sure you discuss any questions you have with your health care provider. Document Released: 07/15/2017 Document Revised: 12/23/2018 Document Reviewed: 07/15/2017 Elsevier Patient Education  2020 Elsevier Inc.  

## 2019-07-06 LAB — COMPREHENSIVE METABOLIC PANEL
ALT: 9 IU/L (ref 0–32)
AST: 14 IU/L (ref 0–40)
Albumin/Globulin Ratio: 0.9 — ABNORMAL LOW (ref 1.2–2.2)
Albumin: 3.7 g/dL (ref 3.5–4.6)
Alkaline Phosphatase: 99 IU/L (ref 39–117)
BUN/Creatinine Ratio: 16 (ref 12–28)
BUN: 21 mg/dL (ref 10–36)
Bilirubin Total: 0.4 mg/dL (ref 0.0–1.2)
CO2: 27 mmol/L (ref 20–29)
Calcium: 9.8 mg/dL (ref 8.7–10.3)
Chloride: 98 mmol/L (ref 96–106)
Creatinine, Ser: 1.34 mg/dL — ABNORMAL HIGH (ref 0.57–1.00)
GFR calc Af Amer: 39 mL/min/{1.73_m2} — ABNORMAL LOW (ref >59)
GFR calc non Af Amer: 34 mL/min/{1.73_m2} — ABNORMAL LOW (ref >59)
Globulin, Total: 3.9 g/dL (ref 1.5–4.5)
Glucose: 88 mg/dL (ref 65–99)
Potassium: 4.6 mmol/L (ref 3.5–5.2)
Sodium: 133 mmol/L — ABNORMAL LOW (ref 134–144)
Total Protein: 7.6 g/dL (ref 6.0–8.5)

## 2019-07-06 LAB — VITAMIN D 25 HYDROXY (VIT D DEFICIENCY, FRACTURES): Vit D, 25-Hydroxy: 15.6 ng/mL — ABNORMAL LOW (ref 30.0–100.0)

## 2019-07-06 LAB — CBC WITH DIFFERENTIAL/PLATELET
Basophils Absolute: 0 10*3/uL (ref 0.0–0.2)
Basos: 0 %
EOS (ABSOLUTE): 0.1 10*3/uL (ref 0.0–0.4)
Eos: 2 %
Hematocrit: 32.9 % — ABNORMAL LOW (ref 34.0–46.6)
Hemoglobin: 10.5 g/dL — ABNORMAL LOW (ref 11.1–15.9)
Immature Grans (Abs): 0 10*3/uL (ref 0.0–0.1)
Immature Granulocytes: 0 %
Lymphocytes Absolute: 2.2 10*3/uL (ref 0.7–3.1)
Lymphs: 30 %
MCH: 29.2 pg (ref 26.6–33.0)
MCHC: 31.9 g/dL (ref 31.5–35.7)
MCV: 92 fL (ref 79–97)
Monocytes Absolute: 0.8 10*3/uL (ref 0.1–0.9)
Monocytes: 11 %
Neutrophils Absolute: 4.1 10*3/uL (ref 1.4–7.0)
Neutrophils: 57 %
Platelets: 273 10*3/uL (ref 150–450)
RBC: 3.59 x10E6/uL — ABNORMAL LOW (ref 3.77–5.28)
RDW: 12.5 % (ref 11.7–15.4)
WBC: 7.3 10*3/uL (ref 3.4–10.8)

## 2019-07-06 LAB — TSH: TSH: 1.02 u[IU]/mL (ref 0.450–4.500)

## 2019-07-06 LAB — LIPID PANEL
Chol/HDL Ratio: 2.9 ratio (ref 0.0–4.4)
Cholesterol, Total: 176 mg/dL (ref 100–199)
HDL: 60 mg/dL (ref >39)
LDL Chol Calc (NIH): 94 mg/dL (ref 0–99)
Triglycerides: 126 mg/dL (ref 0–149)
VLDL Cholesterol Cal: 22 mg/dL (ref 5–40)

## 2019-07-06 LAB — HEMOGLOBIN A1C
Est. average glucose Bld gHb Est-mCnc: 117 mg/dL
Hgb A1c MFr Bld: 5.7 % — ABNORMAL HIGH (ref 4.8–5.6)

## 2019-07-07 ENCOUNTER — Telehealth: Payer: Self-pay

## 2019-07-07 NOTE — Telephone Encounter (Signed)
Patient advised as below. Patient verbalizes understanding and is in agreement with treatment plan.  

## 2019-07-07 NOTE — Telephone Encounter (Signed)
-----   Message from Mar Daring, PA-C sent at 07/07/2019  1:00 PM EDT ----- Hemoglobin is stable. Kidney function has declined slightly compared to the last time it was checked last year. Make sure to push fluids and we can monitor labs. I would recommend [place] recheck a non fasting lab in 3 months. Liver enzymes are normal. Sodium is also borderline low. Recommend adding some salt to foods. Potassium and calcium are normal. A1c is normal. Cholesterol is normal. Thyroid is normal. Vit D is low. Would recommend taking Vit D 5000 IU daily for the low Vit D.

## 2019-08-30 ENCOUNTER — Other Ambulatory Visit: Payer: Self-pay | Admitting: Physician Assistant

## 2019-08-30 DIAGNOSIS — K219 Gastro-esophageal reflux disease without esophagitis: Secondary | ICD-10-CM

## 2019-10-07 ENCOUNTER — Other Ambulatory Visit: Payer: Self-pay

## 2019-10-07 ENCOUNTER — Encounter: Payer: Self-pay | Admitting: Adult Health

## 2019-10-07 ENCOUNTER — Ambulatory Visit (INDEPENDENT_AMBULATORY_CARE_PROVIDER_SITE_OTHER): Payer: Medicare HMO | Admitting: Adult Health

## 2019-10-07 VITALS — BP 120/80 | HR 69 | Temp 97.6°F | Resp 15 | Wt 129.8 lb

## 2019-10-07 DIAGNOSIS — L2489 Irritant contact dermatitis due to other agents: Secondary | ICD-10-CM

## 2019-10-07 DIAGNOSIS — L309 Dermatitis, unspecified: Secondary | ICD-10-CM

## 2019-10-07 MED ORDER — TRIAMCINOLONE ACETONIDE 0.025 % EX OINT: 1.0000 "application " | TOPICAL_OINTMENT | Freq: Two times a day (BID) | CUTANEOUS | 0 refills | Status: AC

## 2019-10-07 NOTE — Progress Notes (Signed)
[name] Female    DOB: 01-Dec-1921   86 y.o.   MRN: [id number] Visit Date: 10/07/2019  Today's Provider: Marcille Buffy, FNP   Chief Complaint  Patient presents with  . Skin Problem   Subjective:     HPI Patient comes in office today with complaints of dry/cracked skin around her face for the past 2-3 months. Patient reports that she noticed that her skin started to peel and believes that it could be due to wearing her face mask, patient states that she has been applying otc Vaseline with no relief.  She wears a medical grade blue facial mask. 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  She denies any new medications, creams, or new facial or laundry products.  She uses Dial soap daily on her face and has for 20 years.  Denies any other exposures.  No recent ill contacts.  She reports the Vaseline seems to be making it worse, she has more peeling and burning of area of face around her mouth and chin with some lower cheek involvement.  Patient  denies any fever, body aches,chills, chest pain, shortness of breath, nausea, vomiting, or diarrhea.     Allergies  Allergen Reactions  . Clarithromycin   . Colesevelam Hcl     Burns stomach.  . Propoxyphene      Current Outpatient Medications:  .  apixaban (ELIQUIS) 2.5 MG TABS tablet, Take 1 tablet (2.5 mg total) by mouth 2 (two) times daily., Disp: 180 tablet, Rfl: 1 .  levETIRAcetam (KEPPRA XR) 500 MG 24 hr tablet, TAKE 2 TABLETS (1,000 MG TOTAL) BY MOUTH ONCE DAILY, Disp: 180 tablet, Rfl: 1 .  metoprolol tartrate (LOPRESSOR) 25 MG tablet, Take 1 tablet (25 mg total) by mouth 2 (two) times daily., Disp: 180 tablet, Rfl: 3 .  pantoprazole (PROTONIX) 40 MG tablet, TAKE 1 TABLET BY MOUTH EVERY DAY, Disp: 90 tablet, Rfl: 3 .  PARoxetine (PAXIL) 20 MG tablet, TAKE 1 TABLET BY  MOUTH EVERY DAY, Disp: 90 tablet, Rfl: 3 .  senna-docusate (SENOKOT-S) 8.6-50 MG tablet, Take 1 tablet by mouth at bedtime as needed for mild constipation., Disp: , Rfl:  .  simvastatin (ZOCOR) 20 MG tablet, Take 1 tablet (20 mg total) by mouth at bedtime., Disp: 90 tablet, Rfl: 3  Review of Systems  Constitutional: Negative.   Respiratory: Negative.   Cardiovascular: Negative.   Gastrointestinal: Negative.   Musculoskeletal: Negative.   Skin: Positive for rash.    Social History   Tobacco Use  . Smoking status: Never Smoker  . Smokeless tobacco: Never Used  Substance Use Topics  . Alcohol use: No      Objective:   Blood pressure 120/80, pulse 69, temperature 97.6 F (36.4 C), temperature source Temporal, resp. rate 15, weight 129 lb 12.8 oz (58.9 kg), SpO2 96 %.  Physical Exam Vitals reviewed.  Constitutional:      General: She is not in acute distress.    Appearance: Normal appearance. She is normal weight. She is not ill-appearing, toxic-appearing or diaphoretic.  HENT:     Head: Normocephalic and atraumatic.     Nose: Nose normal. No congestion.     Mouth/Throat:     Mouth: Mucous membranes are moist.     Pharynx: No oropharyngeal exudate or posterior oropharyngeal erythema.  Eyes:     Pupils: Pupils are equal, round, and reactive to light.  Cardiovascular:     Rate and Rhythm: Regular rhythm.     Heart sounds: Normal heart sounds. No murmur. No friction rub. No gallop.   Pulmonary:     Effort: Pulmonary effort is normal. No respiratory distress.     Breath sounds: Normal breath sounds. No stridor. No wheezing, rhonchi or rales.  Chest:     Chest wall: No tenderness.  Musculoskeletal:     Cervical back: Neck supple.  Skin:    General: Skin is warm.     Capillary Refill: Capillary refill takes less than 2 seconds.     Findings: Rash present. No abrasion, abscess, acne, erythema or wound. Rash is macular. Rash is not vesicular.     Nails: There is no clubbing.      Comments:  Extreme dryness with flaking and with superficially cracked in areas of dry patches, no appreciable erythema, no swelling.  No drainage.  No warmth to skin.  Area of rash is located around the mouth chin and lower cheeks.  Neurological:     Mental Status: She is alert and oriented to person, place, and time.  Psychiatric:        Mood and Affect: Mood normal.        Behavior: Behavior normal.        Thought Content: Thought content normal.        Judgment: Judgment normal.      No results found for any visits on 10/07/19.     Assessment & Plan    1. Periorbital dermatitis Extremely dry skin irritated and flaking. Discontinue use of petroleum jelly. Advised patient she should purchase over-the-counter Eucerin emollient for her area of concern.  She should also use a moisturizing face wash. He is advised to apply Kenalog to area of concern if needed twice daily, she is advised to avoid any eye area or her lips.  She is advised that she should use only a very scant amount on her face.  Meds ordered this encounter  Medications  . triamcinolone (KENALOG) 0.025 % ointment    Sig: Apply 1 application topically 2 (two) times daily. Apply sparingly to affected area.    Dispense:  30 g    Refill:  0    2. Irritant contact dermatitis due to other agents Advised her that possibly wearing a cotton mask under her medical grade mask would help allow her skin to heal.  She is in agreement to do this.  Patient will return to the office should any symptoms change or worsen at any time.  Gust signs of infection/cellulitis and when to return to the office.  Advised patient call the office or your primary care doctor for an appointment if no improvement within 72 hours or if any symptoms change or worsen at any time  Advised ER or urgent Care if after hours or on weekend. Call 911 for emergency symptoms at any time.Patinet verbalized understanding of all instructions given/reviewed and  treatment plan and has no further questions or concerns at this time.     The entirety of the information documented in the History of Present Illness, Review of Systems and Physical Exam were personally obtained by me. Portions of this information were initially documented by the  Certified Medical Assistant whose name is documented in [place] and reviewed by me for thoroughness and accuracy.  I have personally performed the exam and reviewed the chart  and it is accurate to the best of my knowledge.  Haematologist has been used and any errors in dictation or transcription are unintentional.  Kelby Aline. [place], [facility]

## 2019-10-07 NOTE — Patient Instructions (Addendum)
Purchase Eucerin emollient for face lotion use twice a day.    Triamcinolone skin cream, ointment, lotion, or aerosol What is this medicine? TRIAMCINOLONE (trye am SIN oh lone) is a corticosteroid. It is used on the skin to reduce swelling, redness, itching, and allergic reactions. This medicine may be used for other purposes; ask your health care provider or pharmacist if you have questions. COMMON BRAND NAME(S): Aristocort, Aristocort A, Aristocort HP, Cinalog, Cinolar, DERMASORB TA Complete, Flutex, Kenalog, Pediaderm TA, Sila III, SP Rx 228, Triacet, Trianex, Triderm What should I tell my health care provider before I take this medicine? They need to know if you have any of these conditions:  any active infection  large areas of burned or damaged skin  skin wasting or thinning  an unusual or allergic reaction to triamcinolone, corticosteroids, other medicines, foods, dyes, or preservatives  pregnant or trying to get pregnant  breast-feeding How should I use this medicine? This medicine is for external use only. Do not take by mouth. Follow the directions on the prescription label. Wash your hands before and after use. Apply a thin film of medicine to the affected area. Do not cover with a bandage or dressing unless your doctor or health care professional tells you to. Do not use on healthy skin or over large areas of skin. Do not get this medicine in your eyes. If you do, rinse out with plenty of cool tap water. It is important not to use more medicine than prescribed. Do not use your medicine more often than directed. Talk to your pediatrician regarding the use of this medicine in children. Special care may be needed. Elderly patients are more likely to have damaged skin through aging, and this may increase side effects. This medicine should only be used for brief periods and infrequently in older patients. Overdosage: If you think you have taken too much of this medicine contact a  poison control center or emergency room at once. NOTE: This medicine is only for you. Do not share this medicine with others. What if I miss a dose? If you miss a dose, use it as soon as you can. If it is almost time for your next dose, use only that dose. Do not use double or extra doses. What may interact with this medicine? Interactions are not expected. This list may not describe all possible interactions. Give your health care provider a list of all the medicines, herbs, non-prescription drugs, or dietary supplements you use. Also tell them if you smoke, drink alcohol, or use illegal drugs. Some items may interact with your medicine. What should I watch for while using this medicine? Tell your doctor or health care professional if your symptoms do not start to get better within one week. Do not use for more than 14 days. Do not use on healthy skin or over large areas of skin. Tell your doctor or health care professional if you are exposed to anyone with measles or chickenpox, or if you develop sores or blisters that do not heal properly. Do not use an airtight bandage to cover the affected area unless your doctor or health care professional tells you to. If you are to cover the area, follow the instructions carefully. Covering the area where the medicine is applied can increase the amount that passes through the skin and increases the risk of side effects. If treating the diaper area of a child, avoid covering the treated area with tight-fitting diapers or plastic pants. This may increase  the amount of medicine that passes through the skin and increase the risk of serious side effects. What side effects may I notice from receiving this medicine? Side effects that you should report to your doctor or health care professional as soon as possible:  burning or itching of the skin  dark red spots on the skin  infection  painful, red, pus filled blisters in hair follicles  thinning of the skin,  sunburn more likely especially on the face Side effects that usually do not require medical attention (report to your doctor or health care professional if they continue or are bothersome):  dry skin, irritation  unusual increased growth of hair on the face or body This list may not describe all possible side effects. Call your doctor for medical advice about side effects. You may report side effects to FDA at 1-800-FDA-1088. Where should I keep my medicine? Keep out of the reach of children. Store at room temperature between 15 and 30 degrees C (59 and 86 degrees F). Do not freeze. Throw away any unused medicine after the expiration date. NOTE: This sheet is a summary. It may not cover all possible information. If you have questions about this medicine, talk to your doctor, pharmacist, or health care provider.  2020 Elsevier/Gold Standard (2018-06-03 10:50:30)  Contact Dermatitis Dermatitis is redness, soreness, and swelling (inflammation) of the skin. Contact dermatitis is a reaction to something that touches the skin. There are two types of contact dermatitis:  Irritant contact dermatitis. This happens when something bothers (irritates) your skin, like soap.  Allergic contact dermatitis. This is caused when you are exposed to something that you are allergic to, such as poison ivy. What are the causes?  Common causes of irritant contact dermatitis include: ? Makeup. ? Soaps. ? Detergents. ? Bleaches. ? Acids. ? Metals, such as nickel.  Common causes of allergic contact dermatitis include: ? Plants. ? Chemicals. ? Jewelry. ? Latex. ? Medicines. ? Preservatives in products, such as clothing. What increases the risk?  Having a job that exposes you to things that bother your skin.  Having asthma or eczema. What are the signs or symptoms? Symptoms may happen anywhere the irritant has touched your skin. Symptoms include:  Dry or flaky  skin.  Redness.  Cracks.  Itching.  Pain or a burning feeling.  Blisters.  Blood or clear fluid draining from skin cracks. With allergic contact dermatitis, swelling may occur. This may happen in places such as the eyelids, mouth, or genitals. How is this treated?  This condition is treated by checking for the cause of the reaction and protecting your skin. Treatment may also include: ? Steroid creams, ointments, or medicines. ? Antibiotic medicines or other ointments, if you have a skin infection. ? Lotion or medicines to help with itching. ? A bandage (dressing). Follow these instructions at home: Skin care  Moisturize your skin as needed.  Put cool cloths on your skin.  Put a baking soda paste on your skin. Stir water into baking soda until it looks like a paste.  Do not scratch your skin.  Avoid having things rub up against your skin.  Avoid the use of soaps, perfumes, and dyes. Medicines  Take or apply over-the-counter and prescription medicines only as told by your doctor.  If you were prescribed an antibiotic medicine, take or apply it as told by your doctor. Do not stop using it even if your condition starts to get better. Bathing  Take a bath with: ?  Epsom salts. ? Baking soda. ? Colloidal oatmeal.  Bathe less often.  Bathe in warm water. Avoid using hot water. Bandage care  If you were given a bandage, change it as told by your health care provider.  Wash your hands with soap and water before and after you change your bandage. If soap and water are not available, use hand sanitizer. General instructions  Avoid the things that caused your reaction. If you do not know what caused it, keep a journal. Write down: ? What you eat. ? What skin products you use. ? What you drink. ? What you wear in the area that has symptoms. This includes jewelry.  Check the affected areas every day for signs of infection. Check for: ? More redness, swelling, or  pain. ? More fluid or blood. ? Warmth. ? Pus or a bad smell.  Keep all follow-up visits as told by your doctor. This is important. Contact a doctor if:  You do not get better with treatment.  Your condition gets worse.  You have signs of infection, such as: ? More swelling. ? Tenderness. ? More redness. ? Soreness. ? Warmth.  You have a fever.  You have new symptoms. Get help right away if:  You have a very bad headache.  You have neck pain.  Your neck is stiff.  You throw up (vomit).  You feel very sleepy.  You see red streaks coming from the area.  Your bone or joint near the area hurts after the skin has healed.  The area turns darker.  You have trouble breathing. Summary  Dermatitis is redness, soreness, and swelling of the skin.  Symptoms may occur where the irritant has touched you.  Treatment may include medicines and skin care.  If you do not know what caused your reaction, keep a journal.  Contact a doctor if your condition gets worse or you have signs of infection. This information is not intended to replace advice given to you by your health care provider. Make sure you discuss any questions you have with your health care provider. Document Revised: 12/22/2018 Document Reviewed: 03/17/2018 Elsevier Patient Education  [place].

## 2019-11-24 ENCOUNTER — Other Ambulatory Visit: Payer: Self-pay | Admitting: Physician Assistant

## 2019-11-24 DIAGNOSIS — F411 Generalized anxiety disorder: Secondary | ICD-10-CM

## 2019-12-01 ENCOUNTER — Other Ambulatory Visit: Payer: Self-pay | Admitting: Physician Assistant

## 2019-12-01 DIAGNOSIS — G40909 Epilepsy, unspecified, not intractable, without status epilepticus: Secondary | ICD-10-CM

## 2019-12-01 DIAGNOSIS — R569 Unspecified convulsions: Secondary | ICD-10-CM

## 2019-12-01 NOTE — Telephone Encounter (Signed)
Requested  medications are  due for refill today yes  Requested medications are on the active medication list yes  Last refill 11/01/2019  Future visit scheduled yes  Notes to clinic Not delegated

## 2019-12-29 DIAGNOSIS — S32402A Unspecified fracture of left acetabulum, initial encounter for closed fracture: Secondary | ICD-10-CM | POA: Diagnosis not present

## 2019-12-29 DIAGNOSIS — D689 Coagulation defect, unspecified: Secondary | ICD-10-CM | POA: Diagnosis not present

## 2019-12-29 DIAGNOSIS — R0902 Hypoxemia: Secondary | ICD-10-CM | POA: Diagnosis not present

## 2019-12-29 DIAGNOSIS — S52501B Unspecified fracture of the lower end of right radius, initial encounter for open fracture type I or II: Secondary | ICD-10-CM | POA: Diagnosis not present

## 2019-12-29 DIAGNOSIS — R0989 Other specified symptoms and signs involving the circulatory and respiratory systems: Secondary | ICD-10-CM | POA: Diagnosis not present

## 2019-12-29 DIAGNOSIS — S20319A Abrasion of unspecified front wall of thorax, initial encounter: Secondary | ICD-10-CM | POA: Diagnosis not present

## 2019-12-29 DIAGNOSIS — T794XXA Traumatic shock, initial encounter: Secondary | ICD-10-CM | POA: Diagnosis not present

## 2019-12-29 DIAGNOSIS — R918 Other nonspecific abnormal finding of lung field: Secondary | ICD-10-CM | POA: Diagnosis not present

## 2019-12-29 DIAGNOSIS — S272XXA Traumatic hemopneumothorax, initial encounter: Secondary | ICD-10-CM | POA: Diagnosis not present

## 2019-12-29 DIAGNOSIS — S1083XA Contusion of other specified part of neck, initial encounter: Secondary | ICD-10-CM | POA: Diagnosis not present

## 2019-12-29 DIAGNOSIS — S2220XA Unspecified fracture of sternum, initial encounter for closed fracture: Secondary | ICD-10-CM | POA: Diagnosis not present

## 2019-12-29 DIAGNOSIS — Z041 Encounter for examination and observation following transport accident: Secondary | ICD-10-CM | POA: Diagnosis not present

## 2019-12-29 DIAGNOSIS — S81812A Laceration without foreign body, left lower leg, initial encounter: Secondary | ICD-10-CM | POA: Diagnosis not present

## 2019-12-29 DIAGNOSIS — Z20822 Contact with and (suspected) exposure to covid-19: Secondary | ICD-10-CM | POA: Diagnosis not present

## 2019-12-29 DIAGNOSIS — S32441A Displaced fracture of posterior column [ilioischial] of right acetabulum, initial encounter for closed fracture: Secondary | ICD-10-CM | POA: Diagnosis not present

## 2019-12-29 DIAGNOSIS — S3282XA Multiple fractures of pelvis without disruption of pelvic ring, initial encounter for closed fracture: Secondary | ICD-10-CM | POA: Diagnosis not present

## 2019-12-29 DIAGNOSIS — M21861 Other specified acquired deformities of right lower leg: Secondary | ICD-10-CM | POA: Diagnosis not present

## 2019-12-29 DIAGNOSIS — I959 Hypotension, unspecified: Secondary | ICD-10-CM | POA: Diagnosis not present

## 2019-12-29 DIAGNOSIS — Z66 Do not resuscitate: Secondary | ICD-10-CM | POA: Diagnosis not present

## 2019-12-29 DIAGNOSIS — J939 Pneumothorax, unspecified: Secondary | ICD-10-CM | POA: Diagnosis not present

## 2019-12-29 DIAGNOSIS — S2242XA Multiple fractures of ribs, left side, initial encounter for closed fracture: Secondary | ICD-10-CM | POA: Diagnosis not present

## 2019-12-29 DIAGNOSIS — S81811A Laceration without foreign body, right lower leg, initial encounter: Secondary | ICD-10-CM | POA: Diagnosis not present

## 2019-12-29 DIAGNOSIS — M21932 Unspecified acquired deformity of left forearm: Secondary | ICD-10-CM | POA: Diagnosis not present

## 2019-12-29 DIAGNOSIS — I240 Acute coronary thrombosis not resulting in myocardial infarction: Secondary | ICD-10-CM | POA: Diagnosis not present

## 2019-12-29 DIAGNOSIS — S2249XA Multiple fractures of ribs, unspecified side, initial encounter for closed fracture: Secondary | ICD-10-CM | POA: Diagnosis not present

## 2019-12-29 DIAGNOSIS — N179 Acute kidney failure, unspecified: Secondary | ICD-10-CM | POA: Diagnosis not present

## 2019-12-29 DIAGNOSIS — S73005A Unspecified dislocation of left hip, initial encounter: Secondary | ICD-10-CM | POA: Diagnosis not present

## 2019-12-29 DIAGNOSIS — S199XXA Unspecified injury of neck, initial encounter: Secondary | ICD-10-CM | POA: Diagnosis not present

## 2019-12-29 DIAGNOSIS — S32401A Unspecified fracture of right acetabulum, initial encounter for closed fracture: Secondary | ICD-10-CM | POA: Diagnosis not present

## 2019-12-29 DIAGNOSIS — Z4682 Encounter for fitting and adjustment of non-vascular catheter: Secondary | ICD-10-CM | POA: Diagnosis not present

## 2019-12-29 DIAGNOSIS — Z9911 Dependence on respirator [ventilator] status: Secondary | ICD-10-CM | POA: Diagnosis not present

## 2019-12-29 DIAGNOSIS — R571 Hypovolemic shock: Secondary | ICD-10-CM | POA: Diagnosis not present

## 2019-12-29 DIAGNOSIS — J9692 Respiratory failure, unspecified with hypercapnia: Secondary | ICD-10-CM | POA: Diagnosis not present

## 2019-12-29 DIAGNOSIS — R29890 Loss of height: Secondary | ICD-10-CM | POA: Diagnosis not present

## 2019-12-29 DIAGNOSIS — M47812 Spondylosis without myelopathy or radiculopathy, cervical region: Secondary | ICD-10-CM | POA: Diagnosis not present

## 2019-12-29 DIAGNOSIS — S0181XA Laceration without foreign body of other part of head, initial encounter: Secondary | ICD-10-CM | POA: Diagnosis not present

## 2019-12-29 DIAGNOSIS — S8251XA Displaced fracture of medial malleolus of right tibia, initial encounter for closed fracture: Secondary | ICD-10-CM | POA: Diagnosis not present

## 2019-12-29 DIAGNOSIS — S32422A Displaced fracture of posterior wall of left acetabulum, initial encounter for closed fracture: Secondary | ICD-10-CM | POA: Diagnosis not present

## 2019-12-29 DIAGNOSIS — S0083XA Contusion of other part of head, initial encounter: Secondary | ICD-10-CM | POA: Diagnosis not present

## 2019-12-29 DIAGNOSIS — S0011XA Contusion of right eyelid and periocular area, initial encounter: Secondary | ICD-10-CM | POA: Diagnosis not present

## 2019-12-29 DIAGNOSIS — S2232XA Fracture of one rib, left side, initial encounter for closed fracture: Secondary | ICD-10-CM | POA: Diagnosis not present

## 2019-12-29 DIAGNOSIS — S0240CA Maxillary fracture, right side, initial encounter for closed fracture: Secondary | ICD-10-CM | POA: Diagnosis not present

## 2019-12-29 DIAGNOSIS — J9691 Respiratory failure, unspecified with hypoxia: Secondary | ICD-10-CM | POA: Diagnosis not present

## 2019-12-29 DIAGNOSIS — S270XXA Traumatic pneumothorax, initial encounter: Secondary | ICD-10-CM | POA: Diagnosis not present

## 2019-12-29 DIAGNOSIS — I469 Cardiac arrest, cause unspecified: Secondary | ICD-10-CM | POA: Diagnosis not present

## 2019-12-29 DIAGNOSIS — S52502C Unspecified fracture of the lower end of left radius, initial encounter for open fracture type IIIA, IIIB, or IIIC: Secondary | ICD-10-CM | POA: Diagnosis not present

## 2019-12-30 MED ORDER — GLYCOPYRROLATE 0.4 MG/2ML IJ SOLN
0.10 | INTRAMUSCULAR | Status: DC
Start: ? — End: 2019-12-30

## 2019-12-30 MED ORDER — SODIUM CHLORIDE 0.9 % IV SOLN
50.00 | INTRAVENOUS | Status: DC
Start: ? — End: 2019-12-30

## 2019-12-30 MED ORDER — GENERIC EXTERNAL MEDICATION
Status: DC
Start: ? — End: 2019-12-30

## 2019-12-30 MED ORDER — GENERIC EXTERNAL MEDICATION
25.00 | Status: DC
Start: ? — End: 2019-12-30

## 2019-12-30 MED ORDER — LORAZEPAM 2 MG/ML IJ SOLN
2.00 | INTRAMUSCULAR | Status: DC
Start: ? — End: 2019-12-30

## 2020-01-04 ENCOUNTER — Ambulatory Visit: Payer: Self-pay | Admitting: Physician Assistant

## 2020-01-05 ENCOUNTER — Ambulatory Visit: Payer: Self-pay | Admitting: Physician Assistant

## 2020-01-14 DEATH — deceased

## 2020-02-06 ENCOUNTER — Ambulatory Visit: Payer: Medicare HMO | Admitting: Cardiovascular Disease
